# Patient Record
Sex: Female | Born: 1965 | Race: White | Smoking: Never smoker
Health system: Northeastern US, Community
[De-identification: ages and names within clinical notes are randomized; demographics above are authoritative.]

## PROBLEM LIST (undated history)

## (undated) DIAGNOSIS — F418 Other specified anxiety disorders: Secondary | ICD-10-CM

## (undated) DIAGNOSIS — R7611 Nonspecific reaction to tuberculin skin test without active tuberculosis: Secondary | ICD-10-CM

## (undated) DIAGNOSIS — F329 Major depressive disorder, single episode, unspecified: Secondary | ICD-10-CM

## (undated) DIAGNOSIS — F32A Depression, unspecified: Secondary | ICD-10-CM

## (undated) DIAGNOSIS — E162 Hypoglycemia, unspecified: Secondary | ICD-10-CM

## (undated) DIAGNOSIS — J45909 Unspecified asthma, uncomplicated: Secondary | ICD-10-CM

## (undated) DIAGNOSIS — E669 Obesity, unspecified: Secondary | ICD-10-CM

## (undated) HISTORY — DX: Major depressive disorder, single episode, unspecified: F32.9

## (undated) HISTORY — DX: Other specified anxiety disorders: F41.8

## (undated) HISTORY — DX: Nonspecific reaction to tuberculin skin test without active tuberculosis: R76.11

## (undated) HISTORY — PX: TONSILLECTOMY ONE-HALF AGE 12/>: ENT171

## (undated) HISTORY — DX: Unspecified asthma, uncomplicated: J45.909

## (undated) HISTORY — PX: UNLISTED LAPAROSCOPY PROCEDURE APPENDIX: GID338

## (undated) HISTORY — PX: TOTAL ABDOMINAL HYSTERECT W/WO RMVL TUBE OVARY: REP152

## (undated) HISTORY — DX: Depression, unspecified: F32.A

## (undated) HISTORY — DX: Hypoglycemia, unspecified: E16.2

## (undated) HISTORY — DX: Obesity, unspecified: E66.9

## (undated) HISTORY — PX: APPENDECTOMY: GID334

## (undated) HISTORY — PX: OB ANTEPARTUM CARE CESAREAN DLVR & POSTPARTUM: REP299

## (undated) HISTORY — PX: PR ANESTHESIA CESAREAN DELIVERY ONLY: 01961

## (undated) HISTORY — PX: TONSILLECTOMY AND ADENOIDECTOMY: SHX28

---

## 2007-11-15 IMAGING — US US EXTREM LOW VENOUS*L*
1 series · 17 of 24 positions shown · non-contrast
Comparison: none

REASON FOR EXAM: CALF PAIN - RM 2
COMMENTS:

PROCEDURE:     US  - US DOPPLER LOW EXTR LEFT  - April 23, 2007  [DATE]
RESULT:     The LEFT femoral and popliteal veins are normally compressible.
The waveform patterns are normal and the color-flow images are normal.

[Series 1: us extrem low venous*left* · 17 of 33 slices shown]
[im 1/33]
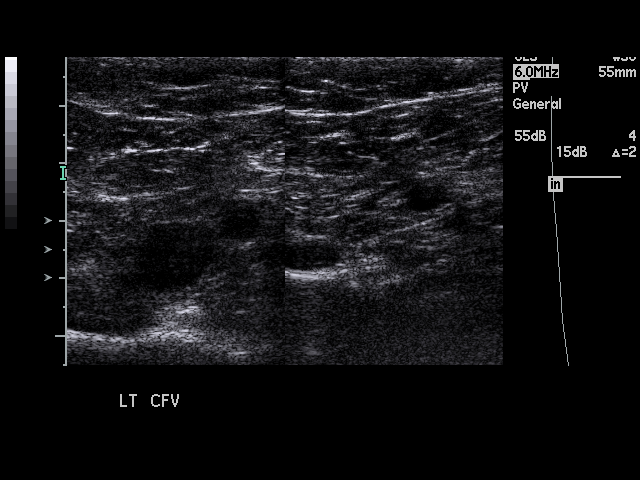
[im 3/33]
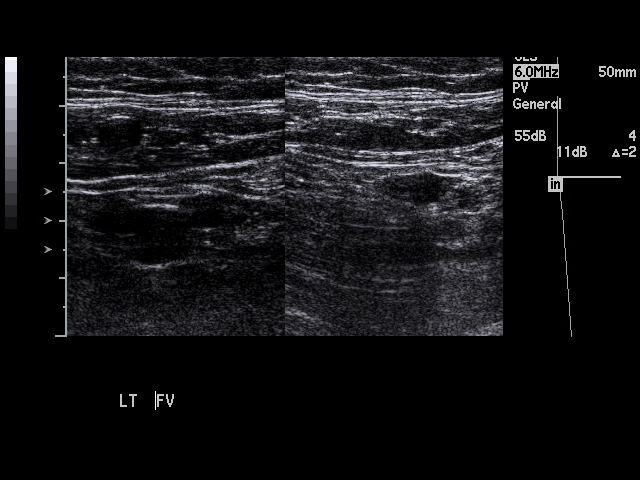
[im 5/33]
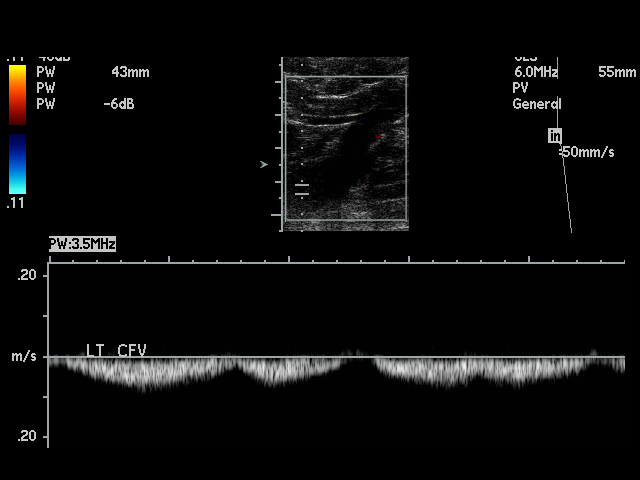
[im 6/33]
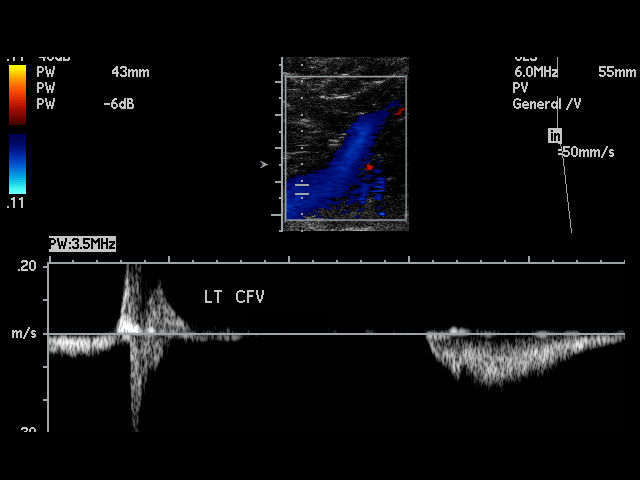
[im 9/33]
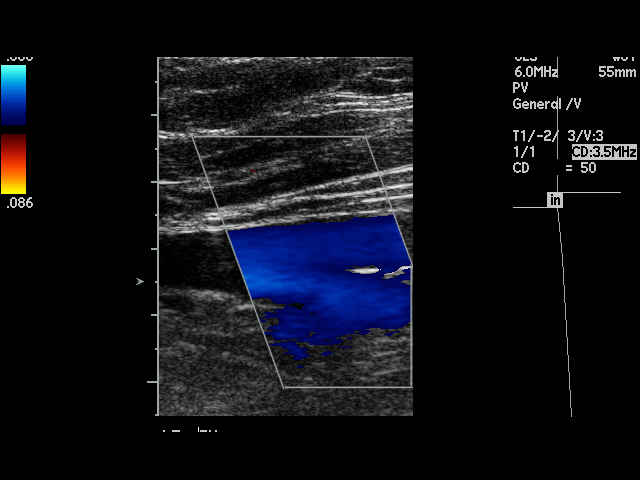
[im 10/33]
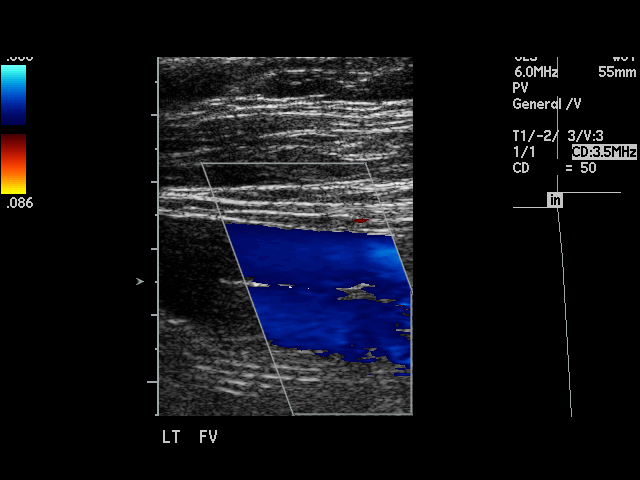
[im 13/33]
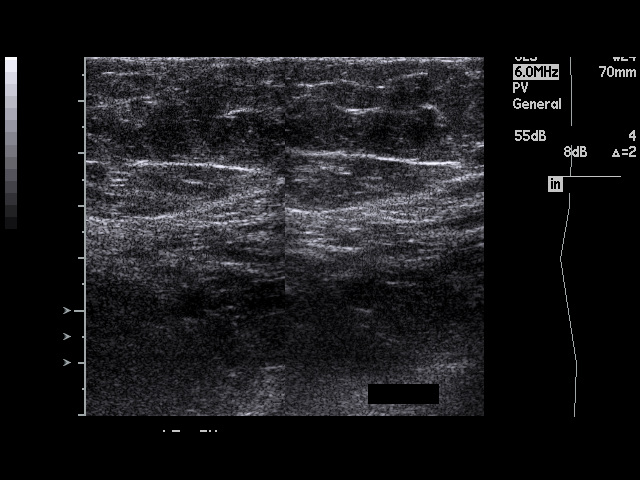
[im 14/33]
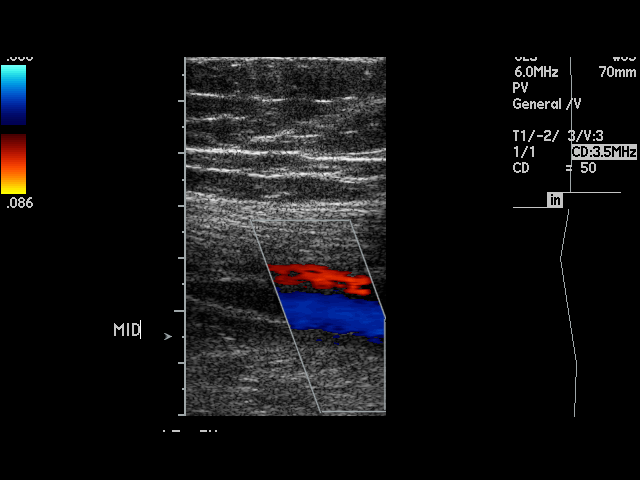
[im 17/33]
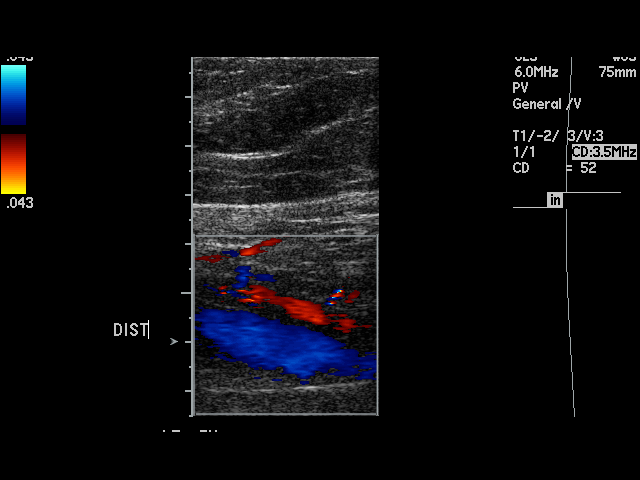
[im 19/33]
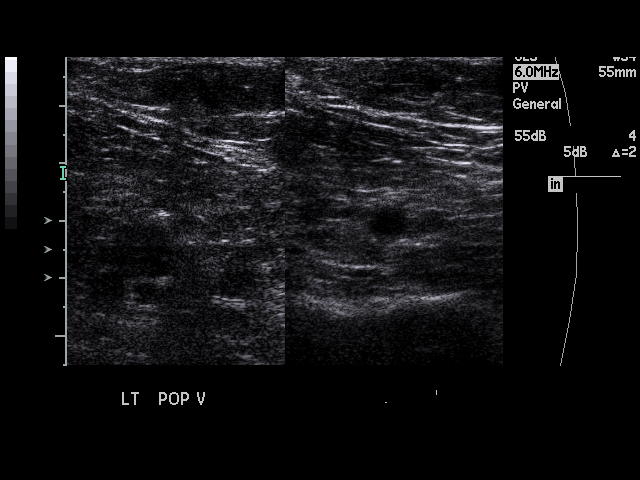
[im 20/33]
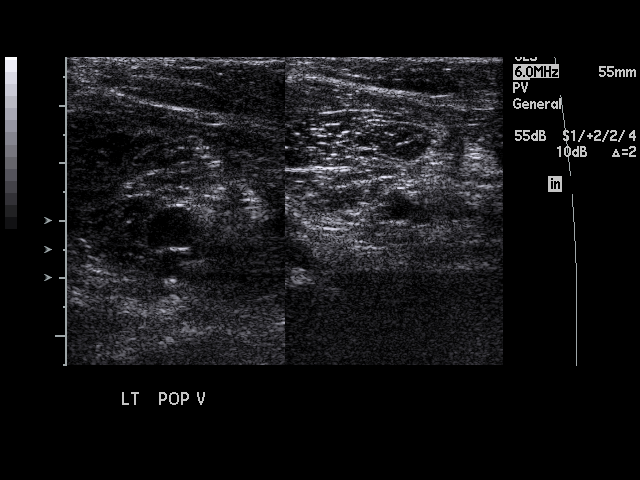
[im 23/33]
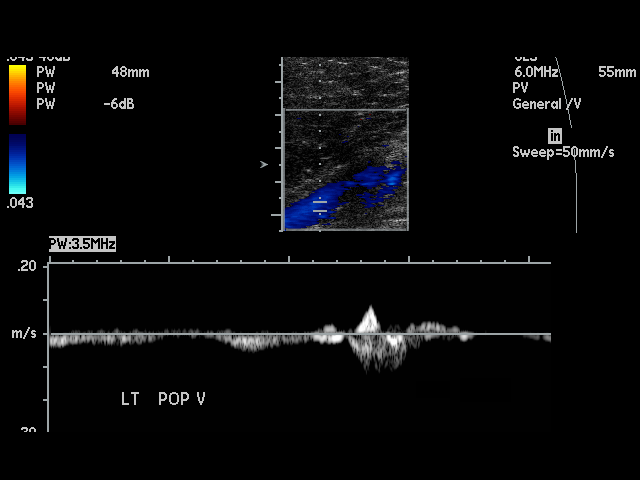
[im 24/33]
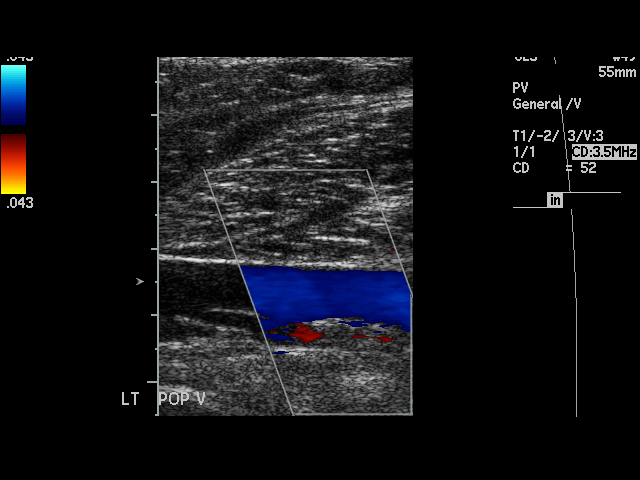
[im 27/33]
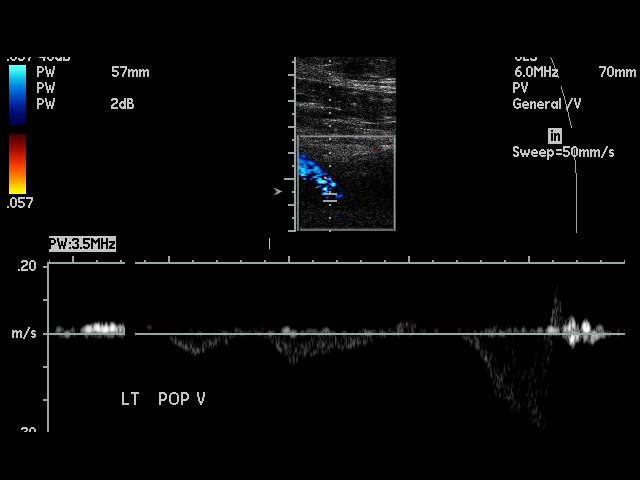
[im 28/33]
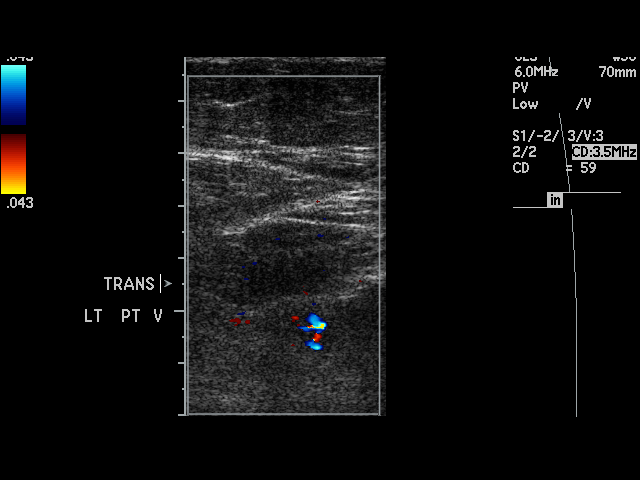
[im 30/33]
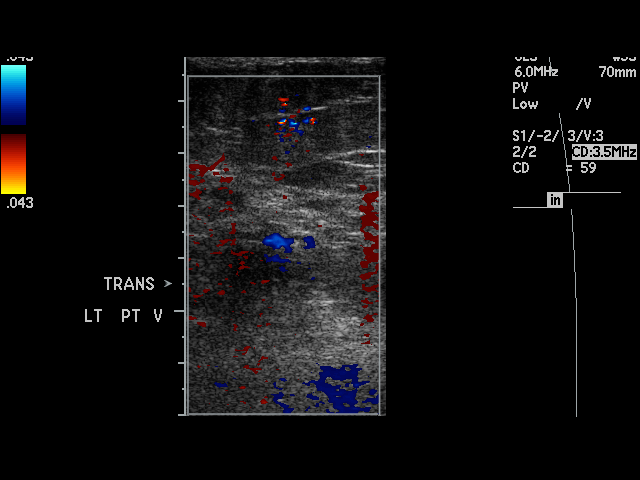
[im 33/33]
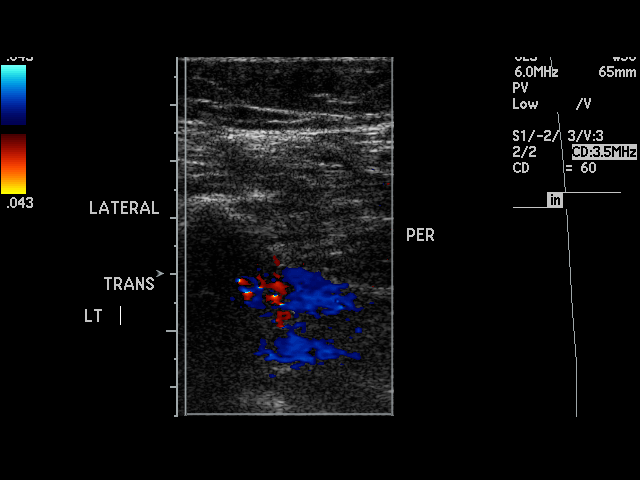

[17 of 24 positions shown; findings below may reference images not displayed]

IMPRESSION: 1)I do not see evidence of thrombus within the LEFT femoral or popliteal
veins.

A preliminary report was sent to the [HOSPITAL] the conclusion
of the study.

## 2008-01-04 ENCOUNTER — Inpatient Hospital Stay (HOSPITAL_BASED_OUTPATIENT_CLINIC_OR_DEPARTMENT_OTHER)
Admission: RE | Admit: 2008-01-04 | Disposition: A | Discharge status: Home / Self Care | Payer: Self-pay | Source: Emergency Department | Attending: Nephrology | Admitting: Nephrology

## 2008-01-04 ENCOUNTER — Encounter (HOSPITAL_BASED_OUTPATIENT_CLINIC_OR_DEPARTMENT_OTHER): Payer: Self-pay

## 2008-01-04 ENCOUNTER — Emergency Department (HOSPITAL_BASED_OUTPATIENT_CLINIC_OR_DEPARTMENT_OTHER)
Admission: RE | Admit: 2008-01-04 | Discharge status: Against Medical Advice | Payer: Self-pay | Source: Emergency Department | Attending: Emergency Medicine | Admitting: Emergency Medicine

## 2008-01-04 LAB — HCG QUALITATIVE SERUM: HCG QUALITATIVE SERUM: NEGATIVE

## 2008-01-04 LAB — CK-MB PROFILE
CK INDEX: 2.2 %
CKMB: 1.1 ng/mL (ref 0.0–4.0)
CREATINE KINASE TOTAL: 49 IU/L (ref 21–215)

## 2008-01-04 LAB — D-DIMER PE/DVT, QUANTITATIVE
D-DIMER PE/DVT, QUANTITATIVE: 1.9 mg/L FEU (ref 0.00–0.99)
D-DIMER PE/DVT, QUANTITATIVE: 1.96 mg/L FEU (ref 0.00–0.99)

## 2008-01-04 LAB — COMPREHENSIVE METABOLIC PANEL
ALANINE AMINOTRANSFERASE: 21 IU/L (ref 7–35)
ALBUMIN: 3.9 g/dl (ref 3.4–4.8)
ALKALINE PHOSPHATASE: 55 IU/L (ref 25–106)
ANION GAP: 7 mmol/L (ref 2–25)
ASPARTATE AMINOTRANSFERASE: 17 IU/L (ref 8–34)
BILIRUBIN TOTAL: 0.6 mg/dl (ref 0.2–1.1)
BUN (UREA NITROGEN): 19 mg/dl (ref 6–20)
CALCIUM: 9.1 mg/dl (ref 8.6–10.0)
CARBON DIOXIDE: 27 mmol/L (ref 22–32)
CHLORIDE: 107 mmol/L (ref 101–111)
CREATININE: 0.8 mg/dl (ref 0.4–1.2)
ESTIMATED GLOMERULAR FILT RATE: >60 mL/min (ref 60–116)
Glucose Random: 108 mg/dl (ref 74–160)
POTASSIUM: 4.4 mmol/L (ref 3.5–5.1)
SODIUM: 141 mmol/L (ref 135–144)
TOTAL PROTEIN: 6.6 g/dl (ref 5.9–7.5)

## 2008-01-04 LAB — SERUM DRUG SCREEN 6 DRUGS
ACETAMINOPHEN: <10 ug/ml — ABNORMAL LOW (ref 10–30)
BARBITURATE: NEGATIVE
BENZODIAZEPINE: NEGATIVE
ETHANOL: <10 mg/dl (ref <10)
SALICYLATE: <4 mg/dl — ABNORMAL LOW (ref 4–29)
TRICYCLIC ANTIDEPRESSANTS: NEGATIVE

## 2008-01-04 LAB — TROPONIN I: TROPONIN I: 0.01 ng/mL (ref 0.00–0.04)

## 2008-01-04 LAB — BLOOD COUNT COMPLETE AUTO&AUTO DIFRNTL WBC
BASOPHIL %: 0.9 % (ref 0.0–2.0)
EOSINOPHIL %: 2.9 % (ref 0.0–7.0)
HEMATOCRIT: 42.3 % (ref 36.0–48.0)
HEMOGLOBIN: 14.2 g/dl (ref 12.0–16.0)
LYMPHOCYTE %: 34.2 % (ref 13.0–39.0)
MEAN CORP HGB CONC: 33.6 g/dl (ref 32.0–36.0)
MEAN CORPUSCULAR HGB: 30.5 pg (ref 27.0–33.0)
MEAN CORPUSCULAR VOL: 90.7 fl (ref 80.0–100.0)
MEAN PLATELET VOLUME: 10.2 fl (ref 6.4–10.8)
MONOCYTE %: 6.7 % (ref 1.0–12.0)
NEUTROPHIL %: 55.3 % (ref 46.0–79.0)
PLATELET COUNT: 256 10*3/uL (ref 150–400)
RBC DISTRIBUTION WIDTH: 12.9 % (ref 11.5–14.3)
RED BLOOD CELL COUNT: 4.66 M/uL (ref 4.50–5.10)
WHITE BLOOD CELL COUNT: 7.1 10*3/uL (ref 4.0–10.8)

## 2008-01-04 LAB — URINE DRUG SCREEN 7 DRUGS
AMPHETAMINES URINE: NEGATIVE
BARBITURATES URINE: NEGATIVE
BENZODIAZEPINES URINE: NEGATIVE
CANNABINOIDS URINE: NEGATIVE
COCAINE METABOLITES URINE: NEGATIVE
ETHANOL URINE: NEGATIVE
OPIATES URINE: NEGATIVE

## 2008-01-04 LAB — XR CHEST 2 VIEWS

## 2008-01-04 LAB — HOLD BLUE TOP TUBE

## 2008-01-04 LAB — CT HEAD WO CONTRAST

## 2008-01-04 LAB — HOLD PURPLE TOP TUBE

## 2008-01-04 LAB — CHG SEDIMENTATION RATE RBC NON-AUTOMATED: RBC SEDIMENTATION RATE: 21 MM/HR — ABNORMAL HIGH (ref 0–15)

## 2008-01-04 NOTE — ED Notes (Signed)
Pt to ED with c/o waking up in bathroom this am, last memory taking shower last night.

## 2008-01-05 LAB — BLOOD COUNT COMPLETE AUTO&AUTO DIFRNTL WBC
BASOPHIL %: 0.5 % (ref 0.0–2.0)
EOSINOPHIL %: 3.6 % (ref 0.0–7.0)
HEMATOCRIT: 39.3 % (ref 36.0–48.0)
HEMOGLOBIN: 13.3 g/dl (ref 12.0–16.0)
LYMPHOCYTE %: 35.8 % (ref 13.0–39.0)
MEAN CORP HGB CONC: 33.9 g/dl (ref 32.0–36.0)
MEAN CORPUSCULAR HGB: 30.5 pg (ref 27.0–33.0)
MEAN CORPUSCULAR VOL: 90 fl (ref 80.0–100.0)
MEAN PLATELET VOLUME: 10.1 fl (ref 6.4–10.8)
MONOCYTE %: 6.6 % (ref 1.0–12.0)
NEUTROPHIL %: 53.5 % (ref 46.0–79.0)
PLATELET COUNT: 254 10*3/uL (ref 150–400)
RBC DISTRIBUTION WIDTH: 13 % (ref 11.5–14.3)
RED BLOOD CELL COUNT: 4.36 M/uL — ABNORMAL LOW (ref 4.50–5.10)
WHITE BLOOD CELL COUNT: 8.2 10*3/uL (ref 4.0–10.8)

## 2008-01-05 LAB — CK-MB PROFILE
CK INDEX: 2.7 %
CKMB: 1.3 ng/mL (ref 0.0–4.0)
CREATINE KINASE TOTAL: 48 IU/L (ref 21–215)

## 2008-01-05 LAB — TROPONIN I: TROPONIN I: 0.03 ng/mL (ref 0.00–0.04)

## 2008-01-05 LAB — BLOOD SUGAR FINGERSTICK (POINT OF CARE)
FINGERSTICK GLUCOSE: 85 mg/dl (ref 74–160)
FINGERSTICK GLUCOSE: 90 mg/dl (ref 74–160)

## 2008-01-05 LAB — D-DIMER PE/DVT, QUANTITATIVE: D-DIMER PE/DVT, QUANTITATIVE: 1.92 mg/L FEU (ref 0.00–0.99)

## 2008-01-05 LAB — US EXTREMITY VENOUS DOPPLER BILATERAL

## 2008-01-05 LAB — NM LUNG PERFUSION & VENTILATION IMAGING

## 2008-01-06 LAB — CHG ASSAY OF AMMONIA: AMMONIA: 16 umol/L (ref 11–35)

## 2008-01-06 LAB — BLOOD SUGAR FINGERSTICK (POINT OF CARE)
FINGERSTICK GLUCOSE: 104 mg/dl (ref 74–160)
FINGERSTICK GLUCOSE: 101 mg/dl (ref 74–160)

## 2008-01-06 LAB — HEMOGLOBIN A1C
HEMOGLOBIN A1C: 5.6 % (ref 0–6.0)
HEMOGLOBIN A1C: 5.6 % (ref 0–6.0)

## 2008-01-06 LAB — ALDOLASE: ALDOLASE: 3.9 U/L (ref 1.2–7.6)

## 2008-01-06 LAB — CK-MB PROFILE
CK INDEX: 2.2 %
CK INDEX: 2.6 %
CKMB: 1.3 ng/mL (ref 0.0–4.0)
CKMB: 1.2 ng/mL (ref 0.0–4.0)
CREATINE KINASE TOTAL: 60 IU/L (ref 21–215)
CREATINE KINASE TOTAL: 47 IU/L (ref 21–215)

## 2008-01-06 LAB — CHG HEPATIC FUNCTION PANEL
ALANINE AMINOTRANSFERASE: 25 IU/L (ref 7–35)
ALBUMIN: 3.7 g/dl (ref 3.4–4.8)
ALKALINE PHOSPHATASE: 50 IU/L (ref 25–106)
ASPARTATE AMINOTRANSFERASE: 16 IU/L (ref 8–34)
BILIRUBIN DIRECT: 0.1 mg/dl (ref 0.0–0.2)
BILIRUBIN TOTAL: 0.6 mg/dl (ref 0.2–1.1)
INDIRECT BILIRUBIN: 0.5 mg/dl (ref 0.2–0.9)
TOTAL PROTEIN: 6.5 g/dl (ref 5.9–7.5)

## 2008-01-06 LAB — MRA BRAIN WO CONTRAST

## 2008-01-06 LAB — EKG-12 ** TO BE DONE BY EKG **

## 2008-01-06 LAB — BASIC METABOLIC PANEL FASTING
ANION GAP: 3 mmol/L (ref 2–25)
BUN (UREA NITROGEN): 11 mg/dl (ref 6–20)
CALCIUM: 8.6 mg/dl (ref 8.6–10.0)
CARBON DIOXIDE: 27 mmol/L (ref 22–32)
CHLORIDE: 109 mmol/L (ref 101–111)
CREATININE: 0.7 mg/dl (ref 0.4–1.2)
ESTIMATED GLOMERULAR FILT RATE: >60 mL/min (ref 60–116)
GLUCOSE FASTING: 107 mg/dl (ref 74–118)
POTASSIUM: 4.4 mmol/L (ref 3.5–5.1)
SODIUM: 139 mmol/L (ref 135–144)

## 2008-01-06 LAB — MRI BRAIN WO CONTRAST

## 2008-01-06 LAB — TROPONIN I
TROPONIN I: 0.04 ng/mL (ref 0.00–0.04)
TROPONIN I: 0.02 ng/mL (ref 0.00–0.04)

## 2008-01-06 LAB — CHG ASSAY OF MAGNESIUM: MAGNESIUM: 2.3 mg/dl (ref 1.8–2.6)

## 2008-01-06 LAB — US RENAL

## 2008-01-06 LAB — CHG ASSAY OF PHOSPHORUS INORGANIC: PHOSPHORUS: 4 mg/dl (ref 2.7–4.7)

## 2008-01-06 LAB — HOLD PURPLE TOP TUBE

## 2008-01-06 LAB — B-TYPE NATRIURETIC PEPTIDE: B-TYPE NATRIURETIC PEPTIDE: 50 pg/mL (ref 5–100)

## 2008-01-06 LAB — H&P

## 2008-01-07 LAB — BLOOD COUNT COMPLETE AUTO&AUTO DIFRNTL WBC
BASOPHIL %: 0.8 % (ref 0.0–2.0)
EOSINOPHIL %: 3.7 % (ref 0.0–7.0)
HEMATOCRIT: 41 % (ref 36.0–48.0)
HEMOGLOBIN: 14.1 g/dl (ref 12.0–16.0)
LYMPHOCYTE %: 40 % — ABNORMAL HIGH (ref 13.0–39.0)
MEAN CORP HGB CONC: 34.4 g/dl (ref 32.0–36.0)
MEAN CORPUSCULAR HGB: 31 pg (ref 27.0–33.0)
MEAN CORPUSCULAR VOL: 90.1 fl (ref 80.0–100.0)
MEAN PLATELET VOLUME: 10.8 fl (ref 6.4–10.8)
MONOCYTE %: 6.7 % (ref 1.0–12.0)
NEUTROPHIL %: 48.8 % (ref 46.0–79.0)
PLATELET COUNT: 252 10*3/uL (ref 150–400)
RBC DISTRIBUTION WIDTH: 12.9 % (ref 11.5–14.3)
RED BLOOD CELL COUNT: 4.56 M/uL (ref 4.50–5.10)
WHITE BLOOD CELL COUNT: 7.9 10*3/uL (ref 4.0–10.8)

## 2008-01-07 LAB — B-TYPE NATRIURETIC PEPTIDE: B-TYPE NATRIURETIC PEPTIDE: 15 pg/mL (ref 5–100)

## 2008-01-07 LAB — CHG ASSAY OF PHOSPHORUS INORGANIC: PHOSPHORUS: 4.1 mg/dl (ref 2.7–4.7)

## 2008-01-07 LAB — BASIC METABOLIC PANEL
ANION GAP: 4 mmol/L (ref 2–25)
BUN (UREA NITROGEN): 15 mg/dl (ref 6–20)
CALCIUM: 9 mg/dl (ref 8.6–10.0)
CARBON DIOXIDE: 31 mmol/L (ref 22–32)
CHLORIDE: 108 mmol/L (ref 101–111)
CREATININE: 0.8 mg/dl (ref 0.4–1.2)
ESTIMATED GLOMERULAR FILT RATE: >60 mL/min (ref 60–116)
Glucose Random: 91 mg/dl (ref 74–160)
POTASSIUM: 5 mmol/L (ref 3.5–5.1)
SODIUM: 143 mmol/L (ref 135–144)

## 2008-01-07 LAB — PTH INTACT WITH CALCIUM
CALCIUM: 9 mg/dl (ref 8.6–10.0)
PTH INTACT: 39 pg/mL (ref 12–88)

## 2008-01-07 LAB — CREATININE 24 HOUR URINE
CREATININE 24HR URINE MG%: 47 mg/dl
CREATININE 24HR URINE: 1.2 g/(24.h) (ref 0.6–1.8)
TOTAL VOLUME 24 HOUR URINE: 2650 ml/24HR — ABNORMAL HIGH (ref 600–1600)

## 2008-01-07 LAB — PROTEIN 24 HOUR URINE: PROTEIN 24HR URINE MG/DL: <6 mg/dl

## 2008-01-07 LAB — XR ABDOMEN (KUB) 1 VIEW

## 2008-01-07 LAB — BLOOD SUGAR FINGERSTICK (POINT OF CARE): FINGERSTICK GLUCOSE: 97 mg/dl (ref 74–160)

## 2008-01-07 LAB — CHG ASSAY OF THYROXINE TOTAL: THYROXINE (T4): 7.4 ug/dl (ref 6.09–12.23)

## 2008-01-07 LAB — TSH (THYROID STIMULATING HORMONE): TSH (THYROID STIM HORMONE): 2.18 u[IU]/mL (ref 0.34–5.60)

## 2008-01-07 LAB — CORTISOL AM: CORTISOL AM: 12.3 ug/dl (ref 4.3–22.4)

## 2008-01-07 LAB — CORTISOL RANDOM: CORTISOL RANDOM: 12.3 ug/dl (ref 3.1–22.4)

## 2008-01-07 LAB — CHG CREATININE CLEARANCE
CREATININE CLEARANCE: 124 mL/min (ref 70–130)
CREATININE: 0.7 mg/dl (ref 0.4–1.2)

## 2008-01-08 LAB — EMERGENCY ROOM NOTE

## 2008-01-09 ENCOUNTER — Encounter (HOSPITAL_BASED_OUTPATIENT_CLINIC_OR_DEPARTMENT_OTHER): Payer: Self-pay | Admitting: Internal Medicine

## 2008-01-09 ENCOUNTER — Ambulatory Visit (HOSPITAL_BASED_OUTPATIENT_CLINIC_OR_DEPARTMENT_OTHER): Payer: Self-pay | Admitting: Internal Medicine

## 2008-01-09 LAB — CHG THYROID HORM UPTK/THYROID HORMONE BINDING RATIO: T3 UPTAKE: 32 % (ref 24–39)

## 2008-01-09 LAB — BLOOD SUGAR FINGERSTICK (POINT OF CARE): FINGERSTICK GLUCOSE: 96 mg/dl (ref 74–160)

## 2008-01-11 LAB — HOLTER MONITORING

## 2008-01-11 LAB — ECHOCARDIOGRAM WITH DOPPLER

## 2008-01-19 LAB — CONSULTATION

## 2008-01-23 ENCOUNTER — Ambulatory Visit: Payer: Self-pay | Admitting: Nephrology

## 2008-01-30 LAB — HOLTER MONITORING

## 2008-02-03 LAB — H&P

## 2008-02-03 LAB — DISCHARGE SUMMARY

## 2008-04-12 ENCOUNTER — Ambulatory Visit (HOSPITAL_BASED_OUTPATIENT_CLINIC_OR_DEPARTMENT_OTHER): Payer: Commercial Managed Care - HMO | Admitting: Family Medicine

## 2008-04-12 ENCOUNTER — Encounter (HOSPITAL_BASED_OUTPATIENT_CLINIC_OR_DEPARTMENT_OTHER): Payer: Self-pay | Admitting: Family Medicine

## 2008-04-12 VITALS — BP 126/90 | HR 88 | Temp 99.2°F | Resp 20 | Wt 254.0 lb

## 2008-04-12 DIAGNOSIS — F418 Other specified anxiety disorders: Secondary | ICD-10-CM

## 2008-04-12 DIAGNOSIS — G43909 Migraine, unspecified, not intractable, without status migrainosus: Secondary | ICD-10-CM

## 2008-04-12 DIAGNOSIS — R7611 Nonspecific reaction to tuberculin skin test without active tuberculosis: Secondary | ICD-10-CM | POA: Insufficient documentation

## 2008-04-12 DIAGNOSIS — G43709 Chronic migraine without aura, not intractable, without status migrainosus: Secondary | ICD-10-CM | POA: Insufficient documentation

## 2008-04-12 DIAGNOSIS — Z113 Encounter for screening for infections with a predominantly sexual mode of transmission: Secondary | ICD-10-CM

## 2008-04-12 HISTORY — DX: Nonspecific reaction to tuberculin skin test without active tuberculosis: R76.11

## 2008-04-12 HISTORY — DX: Other specified anxiety disorders: F41.8

## 2008-04-12 MED ORDER — IMITREX 100 MG PO TABS
ORAL_TABLET | ORAL | Status: DC
Start: 2008-04-12 — End: 2010-01-16

## 2008-04-12 MED ORDER — FLUOXETINE HCL 10 MG PO CAPS
ORAL_CAPSULE | ORAL | Status: DC
Start: 2008-04-12 — End: 2008-05-04

## 2008-04-12 MED ORDER — LORAZEPAM 1 MG PO TABS
ORAL_TABLET | ORAL | Status: DC
Start: 2008-04-12 — End: 2008-07-28

## 2008-04-12 NOTE — Progress Notes (Signed)
S: Had syncopal episode 9m ago and was hospitalized for 69m, soon after started having anxiety. 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  Ok with patients and their family but aggravated by the staff.  90% of anxiety at work, then feels worn out when goes home. This then leads to her depressive feelings. Feels very overwhelmed.    Daughter 21y w/bipolar who ran and got married an is now pregnant.  Pt has separated contact with her 3 daughters and expanded family.     Goes to beach as relaxing place, has a girlfriend she talks to.  Boyfriend someitmes supportive.     Takes Ativan x1 before going to work and this helps.  The brunt of her anxiety stems from her anger issues that she faces at work with her co-workers.  She has developed appropriate coping mechanisms including taking a walk after any frustrating encounter.  She has anxiety about her ability to contain her anger and this exhausts her to the point of depression when she goes home.      She has joined [employer] watchers to help make lifestyle changes in diet and exercise to start losing weight.      O: BP 126/90  Pulse 88  Temp (Src) 99.2 F (37.3 C) (Oral)  Resp 20  Wt 254 lb (115.214 kg)  SpO2 97%  LMP Hysterectomy  Pain Score: 0 (0/10)  Gen: obese female, NAD  CV: RRR no murmur  Resp: CTAB no r/r/w  Eyes: PEARL, EOMI  Neuro: CN2-12 intact, DTR +2 bilaterally.  Sensation grossly intact. 5/5 strength bilaterally upper and lower extremity  EXT: +2 pulses, no edema    A/P: 41yF with anxiety related depression   1. Anxiety related depression: PHQ9 18  - spent >50% of visit in counseling  - Cont Ativan 1mg  daily before going to work  - Start Prozac daily  - Will call for f/u in 1wk and return in 5m for eval  - discussed in detail appropriate coping mechanisms  2. STD screening  - blood HIV (consent signed), RPR, HepB, HepC  - urine GC/C  3. Health maintenance  - check lipids today  - will return for full physical and ?PAP  (pt uncertain if they left her cervix in place)  4. Migraines  - start imitrex prn  * Patient given AVS  * Discussed visit with patient and patient understands the course of the visit and all questions were answered.  * Patient to return to clinic 74m for:      a) f/u depression and physical  *  Patient discussed with Dr. Jackelyn Knife

## 2008-04-13 LAB — CHLAMYDIA GC NAAT
GENPROBE CHLAMYDIA: NEGATIVE
GENPROBE GC: NEGATIVE

## 2008-04-21 LAB — CHG LIPID PANEL
Cholesterol: 192 mg/dl (ref 0–200)
HIGH DENSITY LIPOPROTEIN: 35 mg/dl (ref 35–85)
LOW DENSITY LIPOPROTEIN DIRECT: 125 mg/dl — ABNORMAL HIGH (ref 0–100)
RISK FACTOR: 5.5 — ABNORMAL HIGH (ref ?–4.4)
TRIGLYCERIDES: 91 mg/dl (ref 0–150)

## 2008-04-21 LAB — HIV 1 AND 2 PLUS O ANTIBODY: HIV 1 AND 2 PLUS O SCREEN: NONREACTIVE

## 2008-04-21 LAB — TREPONEMA PALLIDUM AB IGG: TREPONEMA PALLIDUM AB IgG: NONREACTIVE

## 2008-04-21 LAB — HEPATITIS B SURFACE ANTIGEN: HEPATITIS B SURFACE ANTIGEN: NONREACTIVE

## 2008-04-21 LAB — HEPATITIS B SURFACE ANTIBODY: HEPATITIS B SURFACE ANTIBODY: REACTIVE

## 2008-04-21 LAB — HEPATITIS C ANTIBODY: HEPATITIS C ANTIBODY: NEGATIVE

## 2008-04-24 NOTE — Progress Notes (Addendum)
This pt was precepted with the resident and the case was reviewed in its entirety. The pt was not seen by me. I agree with the plan and follow up as is noted in the resident note.

## 2008-05-04 ENCOUNTER — Ambulatory Visit (HOSPITAL_BASED_OUTPATIENT_CLINIC_OR_DEPARTMENT_OTHER): Payer: Commercial Managed Care - HMO | Admitting: Family Medicine

## 2008-05-04 VITALS — BP 108/60 | HR 83 | Temp 99.3°F | Resp 20 | Ht 61.0 in | Wt 257.0 lb

## 2008-05-04 DIAGNOSIS — Z0000 Encounter for general adult medical examination without abnormal findings: Principal | ICD-10-CM

## 2008-05-04 DIAGNOSIS — E669 Obesity, unspecified: Secondary | ICD-10-CM

## 2008-05-04 DIAGNOSIS — F418 Other specified anxiety disorders: Secondary | ICD-10-CM

## 2008-05-04 HISTORY — DX: Obesity, unspecified: E66.9

## 2008-05-04 MED ORDER — FLUOXETINE HCL 20 MG PO CAPS
ORAL_CAPSULE | ORAL | Status: DC
Start: 2008-05-04 — End: 2008-08-21

## 2008-05-04 NOTE — Patient Instructions (Signed)
Exercise And Diabetes  Copyright © 1997 American Diabetes Association       Exercise, along with good nutrition and medications (insulin or oral diabetes pills), is important for good diabetes control. Good diabetes control means keeping your blood sugar level as close to normal (90-126 milligrams per deciliter [mg/dl]) as possible. Exercise is especially good for people with diabetes.       Why Is Exercise Important?   Exercise usually lowers blood sugar. That helps your body use its food supply better. Also, exercise may help insulin work better. If you are overweight, exercise, plus careful attention to diet, can help take off extra pounds.     Exercise is important in many other ways. It improves the flow of blood through the small blood vessels and increases your heart's pumping power. The right exercise program may make you look and feel better.       What Kinds Of Exercise Are Best?   Your health care provider can help you decide what kinds of exercise, and how much exercise, are best suited to your needs. If your blood sugar control is poor, do not exercise. Get medical advice first.     If you have retinopathy (diabetic eye disease) or blood vessel problems, you need your doctor's advice about which activities are safe.     Exercise has value only if it's done regularly. People with diabetes should exercise at least several days a week.     What do people with type 1 diabetes need to know about exercising?     Before starting any exercise program, check with your doctor. Your activity must be planned to fit in with your meal plan and with the action times and amounts of your insulin.     If you're exercising more than 1 hour after eating, it's a good idea to eat before your start. As a rule, a high-carbohydrate snack is good before or during mild to moderate exercise (walking, biking, or golf). Such a snack could be 6 ounces of fruit juice or one half of a plain bagel.     If you plan on doing heavier  exercise (aerobics, running, squash, or handball), you may need to eat a little more, such as half of a meat sandwich and a cup of low-fat milk.     It's always a good idea to check your blood sugar level before you start exercising. If you are low (under 70 mg/dl), you will need a snack to avoid having low blood sugar while you exercise. This would cause an insulin reaction.     A reaction might make you feel faint, sweaty, dizzy, or confused. An insulin reaction can occur while you exercise or several hours, even up to 12 hours, later.     If you feel an insulin reaction coming on while exercising, STOP. IMMEDIATELY have one-half cup of orange juice or nondiet soft drink or 3 glucose tablets.     You need to treat an insulin reaction as soon as you feel it. Don't wait, otherwise it could become worse. Whenever you exercise, you should bring along some raisins or Lifesavers' candy to eat just in case. They will raise your blood sugar level.     If you play a team sport such as baseball or basketball, you should let someone know you have diabetes and teach them how to help you, if needed. If you like running or cycling, do them with a friend or family member. If you can't   find anyone to go with you, let someone know where you are going and when you will be back.     With regular exercise, you will need to test your blood sugar more often.     What do people with type 2 diabetes need to know?     Almost 9 of 10 people with type 2 diabetes are overweight. Most often, they are also past 40. In many people, type 2 diabetes can be controlled through diet and exercise. For these reasons, exercise is a very important part of the diabetes control plan for those with type 2 diabetes.   Exercise burns calories that your body would otherwise store as extra weight. And because exercise also helps lower blood sugar levels, exercise can help your diabetes control.   If you use insulin or oral diabetes pills to control your type 2  diabetes, you should know your blood sugar level before you start exercising. If you are low, you may need a snack.   How Do You Begin Exercising?   The first step is to check with your doctor. Together, you can decide how much and what kinds of exercise are right for you. The right exercise, in the right amount, can do wonders. When balanced with your meal plan and medications, exercise will help you feel healthier and happier.

## 2008-05-04 NOTE — Progress Notes (Signed)
Chief Complaint:   Erica Arroyo is a 42 year old female who presents for PHYSICAL    History of Present Illness:    Pt c/o stress at work and at home as daughter who is victom of DV just moved home.     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]        Past Surgical History    ANESTHESIA CESAREAN DELIVERY ONLY     Comment: x3    UNLIS LAPS PX APPENDIX     TAH +-RMVL TUBE +-RMVL OVARY     Comment: ?cervix, at age 54 with "hemorrhaging"           Current outpatient prescriptions prior to encounter:  LORAZEPAM 1 MG OR TABS 1 TABLET PO once daily Disp: 30 Rfl: 2   IMITREX 100 MG OR TABS 1 TABLET 1 TIME ONLY Disp: 30 Rfl: 0   FLUOXETINE HCL 10 MG OR CAPS 1 CAPSULE at Bedtime Disp: 30 Rfl: 2         Review of Patient's Allergies indicates:   Amoxicillin             Hives   Penicillins             Rash   Tetracycline            Rash   Meperidine hcl          Rash   Hydromorphone hcl 1     Rash   Flu virus vaccine       Swelling      Social History   Marital Status: Divorced  Spouse Name: N/A    Years of Education: N/A  Number of Children: N/A     Occupational History  None on file     Social History Main Topics   Tobacco Use: Never    Alcohol Use: Yes    Comment: once every 3-33months    Drug Use: No    Sexually Active: Yes  Partner(s): Female     Other Topics Concern   None     Social History Narrative    Lives alone, works as Engineer, 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  Has a boyfriend, does not live together.  Been together since 11/08.     Middle daughter bipolar, recently ran away and got married and pregnant at age 14.     Other 2 daughters with behavioral issues and not in regular contact.         Family History    Heart Father    Comment: multiple MI first in early 54's, has multiple Stents    Renal Father    Comment: on dialysis    Hypertension Father    GI Father    Comment: diverticulitis    Diabetes Father    No Known Family  History Mother    Psychiatric Illness Brother    Comment: bipolar, schizophrenic    Neurological Sister    Comment: ? early dementia                Review Of Systems    Skin: negative  Eyes: negative  Ears/Nose/Throat: negative  Respiratory: has some shortness of breath when exercising a lot.  Cardiovascular: feels some chest heaviness when exercising a lot, no pain or pressure, just discomfort  Gastrointestinal: negative  Genitourinary: negative  Musculoskeletal: negative  Neurologic: negative  Psychiatric:has depression and anxiety  Hematologic/Lymphatic/Immunologic: negative  Endocrine: negative                    PHYSICAL EXAMINATION:  BP 108/60  Pulse 83  Temp (Src) 99.3 F (37.4 C) (Oral)  Resp 20  Ht 5\' 1"  (1.549 m)  Wt 257 lb (116.574 kg)  SpO2 97%  LMP Hysterectomy  General appearance: healthy, alert, well developed, well nourished, obese  Skin: skin color, texture, turgor are normal  Head: Normocephalic. No masses, lesions, tenderness or abnormalities  Eyes: conjunctivae/corneas clear. PERRL, EOM's intact. Fundi benign  Ears: External ears normal. Canals clear. TM's normal.  Nose/Sinuses: Nares normal. Septum midline. Mucosa normal. No drainage or sinus tenderness.  Oropharynx: Lips, mucosa, and tongue normal. Teeth and gums normal. Oropharynx moist and without lesion  Neck: Neck supple. No adenopathy. Thyroid symmetric, normal size, and without nodularity  Back: Back symmetric, no curvature. ROM normal. No CVA tenderness.  Lungs: Percussion normal. Good diaphragmatic excursion. Lungs clear to auscultation bilaterally  Heart: PMI normal. No lifts, heaves, or thrills. RRR. No murmurs, clicks, gallops or rubs  Breasts: Inspection negative. No nipple discharge or bleeding. No masses or nodularity palpable  Abdomen: Abdomen soft, non-tender. BS normal. No masses, no organomegaly  Extremities: Extremities normal. No deformities, edema, or skin discoloration  Musculoskeletal: Spine ROM normal. Muscular  strength intact.  Peripheral pulses: radial=4/4, femoral=4/4, popliteal=4/4, dorsalis pedis=4/4  Neuro: Gait normal. Reflexes normal and symmetric. Sensation grossly normal       Assessment / Plan: 50y F here for physical exam  - health maintenance labs done at last visit were wnl  - no PAP as pt had TAH  - Obesity: pt referred to GI for consideration for gastric bypass  - Depression/anxiety: pt to increase prozac to 20mg  daily  - Mammogram referral given today    * Patient given AVS  * Discussed visit with patient and patient understands the course of the visit and all questions were answered.  * Patient to return to clinic ~4wk for:      a) f/u depression/anxiety  *  Patient discussed with Dr. Lawson Fiscal    -

## 2008-05-10 NOTE — Progress Notes (Addendum)
I personally reviewed patient's history and exam findings with Dr. Rana.  I confirm the key elements of the history and physical exam as described in the resident's note.  I agree with the assessment and plan as described by the resident.  Please see resident's note for further details.

## 2008-05-14 ENCOUNTER — Encounter (HOSPITAL_BASED_OUTPATIENT_CLINIC_OR_DEPARTMENT_OTHER): Payer: Self-pay | Admitting: Family Medicine

## 2008-05-21 ENCOUNTER — Ambulatory Visit (HOSPITAL_BASED_OUTPATIENT_CLINIC_OR_DEPARTMENT_OTHER): Payer: Commercial Managed Care - HMO | Admitting: Family Medicine

## 2008-05-21 ENCOUNTER — Telehealth (HOSPITAL_BASED_OUTPATIENT_CLINIC_OR_DEPARTMENT_OTHER): Payer: Self-pay | Admitting: Ambulatory Care

## 2008-05-21 ENCOUNTER — Inpatient Hospital Stay (HOSPITAL_BASED_OUTPATIENT_CLINIC_OR_DEPARTMENT_OTHER)
Admission: RE | Admit: 2008-05-21 | Disposition: A | Discharge status: Home / Self Care | Payer: Self-pay | Source: Emergency Department | Attending: Emergency Medicine | Admitting: Emergency Medicine

## 2008-05-21 VITALS — BP 126/100 | HR 70 | Temp 98.3°F | Resp 16 | Wt 255.0 lb

## 2008-05-21 DIAGNOSIS — R1013 Epigastric pain: Principal | ICD-10-CM

## 2008-05-21 LAB — COMPREHENSIVE METABOLIC PANEL
ALANINE AMINOTRANSFERASE: 21 IU/L (ref 7–35)
ALBUMIN: 4 g/dl (ref 3.4–4.8)
ALKALINE PHOSPHATASE: 51 IU/L (ref 25–106)
ANION GAP: 8 mmol/L (ref 2–25)
ASPARTATE AMINOTRANSFERASE: 17 IU/L (ref 8–34)
BILIRUBIN TOTAL: 0.6 mg/dl (ref 0.2–1.1)
BUN (UREA NITROGEN): 10 mg/dl (ref 6–20)
CALCIUM: 9.3 mg/dl (ref 8.6–10.3)
CARBON DIOXIDE: 27 mmol/L (ref 22–32)
CHLORIDE: 101 mmol/L (ref 101–111)
CREATININE: 0.6 mg/dl (ref 0.4–1.2)
ESTIMATED GLOMERULAR FILT RATE: >60 mL/min (ref 60–116)
Glucose Random: 93 mg/dl (ref 74–160)
POTASSIUM: 4.2 mmol/L (ref 3.5–5.1)
SODIUM: 136 mmol/L (ref 135–144)
TOTAL PROTEIN: 7.1 g/dl (ref 5.9–7.5)

## 2008-05-21 LAB — BLOOD COUNT COMPLETE AUTO&AUTO DIFRNTL WBC
BASOPHIL %: 0.7 % (ref 0.0–2.0)
EOSINOPHIL %: 2.5 % (ref 0.0–7.0)
HEMATOCRIT: 41.4 % (ref 36.0–48.0)
HEMOGLOBIN: 14.1 g/dl (ref 12.0–16.0)
LYMPHOCYTE %: 35 % (ref 13.0–39.0)
MEAN CORP HGB CONC: 34.1 g/dl (ref 32.0–36.0)
MEAN CORPUSCULAR HGB: 30.2 pg (ref 27.0–33.0)
MEAN CORPUSCULAR VOL: 88.5 fl (ref 80.0–100.0)
MEAN PLATELET VOLUME: 10 fl (ref 6.4–10.8)
MONOCYTE %: 6.4 % (ref 1.0–12.0)
NEUTROPHIL %: 55.4 % (ref 46.0–79.0)
PLATELET COUNT: 265 10*3/uL (ref 150–400)
RBC DISTRIBUTION WIDTH: 12.7 % (ref 11.5–14.3)
RED BLOOD CELL COUNT: 4.68 M/uL (ref 4.50–5.10)
WHITE BLOOD CELL COUNT: 8.9 10*3/uL (ref 4.0–10.8)

## 2008-05-21 LAB — LIPASE: LIPASE: 75 U/L — ABNORMAL HIGH (ref 10–50)

## 2008-05-21 LAB — AMYLASE: AMYLASE: 108 U/L (ref 15–133)

## 2008-05-21 LAB — US ABDOMEN COMPLETE

## 2008-05-21 LAB — CHG BILIRUBIN DIRECT: BILIRUBIN DIRECT: 0.1 mg/dl (ref 0.0–0.2)

## 2008-05-21 LAB — US ABDOMEN PELVIS SCROTUM & OR RETROPERITONEUM DOPPLER LIMITED

## 2008-05-21 MED ORDER — OXYCODONE-ACETAMINOPHEN 5-325 MG PO TABS
ORAL_TABLET | ORAL | Status: AC
Start: 2008-05-21 — End: 2008-05-28

## 2008-05-21 NOTE — Telephone Encounter (Signed)
Staff Message copied by Caffie Pinto on Mon May 21, 2008 10:32 AM  ------   Message from: RIVERA, [place]   Created: Mon May 21, 2008 9:55 AM   Regarding: call back    Erica Arroyo [id number], 42 year old, female, Telephone Information:  Home Phone [phone]  Work Phone [phone]      Cleotis Lema NUMBER: [phone]  Cell phone:   Other phone:    Available times:    Patient's language of care: English    Patient does not need an interpreter.    Patient's Care Team:     Person calling on behalf of patient: Patient (self)    Calls today with a sick call. Pt would like to come in to be seen. Pls call back.    Patient's Preferred Pharmacy: [facility] [address]  Phone: [phone] Fax: [phone]

## 2008-05-21 NOTE — Telephone Encounter (Signed)
Called patient back. Still feeling sick. Bad cough. She had a bad night. She is feeling nausea and have abd pain and low grad temps. She would like to be seen. Appointment made.

## 2008-05-21 NOTE — Progress Notes (Signed)
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  Per pt, was seen at [facility] 2-3 weeks ago with cough and fever and was dx with bronchitis.  Given z-pac and prednisone taper.  Breathing better, coughing on and off.  Still getting fevers on and off.  Yesterday evening started with abd pain- epigastric pain and nausea.  Was 12/10 on pain scale.  + nausea.  No emesis, no diarrhea, no hematochezia or melena.  Pain constant since yesterday.  Hasn't eaten since then.  Pain now 7/10 on pain scale.  Was thinking about going to ED last night and this am, but made appt instead.  Pain radiating to back.  + sweats.  No sob.  No chest pain except for a little while yesterday but it resolved.  + bad taste in mouth.  + fevers.  No dysuria.  No vag discharge.  No smoker.  Per pt, no hx high chol or HTN.  Dad with cad- first dx age 41.    Sug hx- +appendectomy, hysterectomy, c-sections.    Obj:  BP 126/100  Pulse 70  Temp (Src) 98.3 F (36.8 C) (Oral)  Resp 16  Wt 255 lb (115.667 kg)  SpO2 97%  LMP Hysterectomy  Pain Score: 6 (6/10)  Gen: laying on table- uncomfortable, holding basin.  Chest: CTA b/l no wrr   CV: S1S2 heard RRR no murmurs  Abd: + TTP with guarding epigastric, RUQ and LUQ.  + murphy's sign.  Belly soft + BS.      Extr: no edema LE b/l.    A/P:  Erica Arroyo is a 41 year old female with epigastric pain and hx fevers.  DDX includes cholecystitis, pancreatitis, gastro, gastric ulcer.  Given nature of pain, pt's exam and how she currently feels will send to ED for further w/o.  Daughter is accompanying her and will drive her.  Pt going [facility].  Called physician there to let them know she was coming.    Pt agrees to plan.

## 2008-05-22 LAB — CHG HEPATIC FUNCTION PANEL
ALANINE AMINOTRANSFERASE: 23 IU/L (ref 7–35)
ALBUMIN: 3.6 g/dl (ref 3.4–4.8)
ALKALINE PHOSPHATASE: 49 IU/L (ref 25–106)
ASPARTATE AMINOTRANSFERASE: 18 IU/L (ref 8–34)
BILIRUBIN DIRECT: 0.1 mg/dl (ref 0.0–0.2)
BILIRUBIN TOTAL: 0.4 mg/dl (ref 0.2–1.1)
INDIRECT BILIRUBIN: 0.3 mg/dl (ref 0.2–0.9)
TOTAL PROTEIN: 6.5 g/dl (ref 5.9–7.5)

## 2008-05-22 LAB — BLOOD COUNT COMPLETE AUTO&AUTO DIFRNTL WBC
BASOPHIL %: 0.8 % (ref 0.0–2.0)
EOSINOPHIL %: 2.9 % (ref 0.0–7.0)
HEMATOCRIT: 38.1 % (ref 36.0–48.0)
HEMOGLOBIN: 12.9 g/dl (ref 12.0–16.0)
LYMPHOCYTE %: 37.8 % (ref 13.0–39.0)
MEAN CORP HGB CONC: 33.8 g/dl (ref 32.0–36.0)
MEAN CORPUSCULAR HGB: 30 pg (ref 27.0–33.0)
MEAN CORPUSCULAR VOL: 88.8 fl (ref 80.0–100.0)
MEAN PLATELET VOLUME: 9.7 fl (ref 6.4–10.8)
MONOCYTE %: 7.7 % (ref 1.0–12.0)
NEUTROPHIL %: 50.8 % (ref 46.0–79.0)
PLATELET COUNT: 242 10*3/uL (ref 150–400)
RBC DISTRIBUTION WIDTH: 12.8 % (ref 11.5–14.3)
RED BLOOD CELL COUNT: 4.29 M/uL — ABNORMAL LOW (ref 4.50–5.10)
WHITE BLOOD CELL COUNT: 7.7 10*3/uL (ref 4.0–10.8)

## 2008-05-22 LAB — BASIC METABOLIC PANEL
ANION GAP: 5 mmol/L (ref 2–25)
BUN (UREA NITROGEN): 11 mg/dl (ref 6–20)
CALCIUM: 9 mg/dl (ref 8.6–10.3)
CARBON DIOXIDE: 32 mmol/L (ref 22–32)
CHLORIDE: 100 mmol/L — ABNORMAL LOW (ref 101–111)
CREATININE: 0.9 mg/dl (ref 0.4–1.2)
ESTIMATED GLOMERULAR FILT RATE: >60 mL/min (ref 60–116)
Glucose Random: 116 mg/dl (ref 74–160)
POTASSIUM: 4.7 mmol/L (ref 3.5–5.1)
SODIUM: 137 mmol/L (ref 135–144)

## 2008-05-22 LAB — PROTHROMBIN TIME
INR: 1 — ABNORMAL LOW (ref 2.0–3.5)
PROTHROMBIN TIME: 10.6 SECONDS (ref 9.5–11.5)

## 2008-05-22 LAB — H&P

## 2008-05-22 LAB — LIPASE: LIPASE: 34 U/L (ref 10–50)

## 2008-05-22 LAB — APTT: APTT: 27.5 SECONDS (ref 23.7–33.3)

## 2008-05-22 LAB — AMYLASE: AMYLASE: 86 U/L (ref 15–133)

## 2008-05-22 LAB — NM HIDA SCAN

## 2008-05-23 LAB — CHG HEPATIC FUNCTION PANEL
ALANINE AMINOTRANSFERASE: 18 IU/L (ref 7–35)
ALBUMIN: 3.3 g/dl — ABNORMAL LOW (ref 3.4–4.8)
ALKALINE PHOSPHATASE: 41 IU/L (ref 25–106)
ASPARTATE AMINOTRANSFERASE: 14 IU/L (ref 8–34)
BILIRUBIN DIRECT: 0.1 mg/dl (ref 0.0–0.2)
BILIRUBIN TOTAL: 0.3 mg/dl (ref 0.2–1.1)
INDIRECT BILIRUBIN: 0.2 mg/dl (ref 0.2–0.9)
TOTAL PROTEIN: 6 g/dl (ref 5.9–7.5)

## 2008-05-23 LAB — BLOOD COUNT COMPLETE AUTO&AUTO DIFRNTL WBC
BASOPHIL %: 0.8 % (ref 0.0–2.0)
EOSINOPHIL %: 3.1 % (ref 0.0–7.0)
HEMATOCRIT: 36.5 % (ref 36.0–48.0)
HEMOGLOBIN: 12.5 g/dl (ref 12.0–16.0)
LYMPHOCYTE %: 42.3 % — ABNORMAL HIGH (ref 13.0–39.0)
MEAN CORP HGB CONC: 34.2 g/dl (ref 32.0–36.0)
MEAN CORPUSCULAR HGB: 30.1 pg (ref 27.0–33.0)
MEAN CORPUSCULAR VOL: 87.9 fl (ref 80.0–100.0)
MEAN PLATELET VOLUME: 9.5 fl (ref 6.4–10.8)
MONOCYTE %: 7.9 % (ref 1.0–12.0)
NEUTROPHIL %: 45.9 % — ABNORMAL LOW (ref 46.0–79.0)
PLATELET COUNT: 246 10*3/uL (ref 150–400)
RBC DISTRIBUTION WIDTH: 12.8 % (ref 11.5–14.3)
RED BLOOD CELL COUNT: 4.16 M/uL — ABNORMAL LOW (ref 4.50–5.10)
WHITE BLOOD CELL COUNT: 7.8 10*3/uL (ref 4.0–10.8)

## 2008-05-23 LAB — AMYLASE: AMYLASE: 79 U/L (ref 15–133)

## 2008-05-23 LAB — LIPASE: LIPASE: 32 U/L (ref 10–50)

## 2008-05-24 LAB — BLOOD SUGAR FINGERSTICK (POINT OF CARE): FINGERSTICK GLUCOSE: 157 mg/dl (ref 74–160)

## 2008-05-24 LAB — GI OPERATIVE NOTE

## 2008-05-25 LAB — SURGICAL PATH SPECIMEN

## 2008-06-04 LAB — EMERGENCY ROOM NOTE

## 2008-06-08 ENCOUNTER — Ambulatory Visit (HOSPITAL_BASED_OUTPATIENT_CLINIC_OR_DEPARTMENT_OTHER): Payer: Commercial Managed Care - HMO | Admitting: Surgery

## 2008-06-14 ENCOUNTER — Ambulatory Visit (HOSPITAL_BASED_OUTPATIENT_CLINIC_OR_DEPARTMENT_OTHER): Payer: Commercial Managed Care - HMO | Admitting: Family Medicine

## 2008-06-15 LAB — SURG SPEC CLINIC NOTE

## 2008-06-24 LAB — DISCHARGE SUMMARY

## 2008-06-26 ENCOUNTER — Telehealth (HOSPITAL_BASED_OUTPATIENT_CLINIC_OR_DEPARTMENT_OTHER): Payer: Self-pay | Admitting: Family Medicine

## 2008-06-26 ENCOUNTER — Encounter (HOSPITAL_BASED_OUTPATIENT_CLINIC_OR_DEPARTMENT_OTHER): Payer: Self-pay | Admitting: Family Medicine

## 2008-06-26 ENCOUNTER — Telehealth (HOSPITAL_BASED_OUTPATIENT_CLINIC_OR_DEPARTMENT_OTHER): Payer: Self-pay

## 2008-06-26 DIAGNOSIS — E162 Hypoglycemia, unspecified: Secondary | ICD-10-CM | POA: Insufficient documentation

## 2008-06-26 HISTORY — DX: Hypoglycemia, unspecified: E16.2

## 2008-06-26 NOTE — Telephone Encounter (Deleted)
Staff Message copied by Leitha Schuller on Tue Jun 26, 2008 4:31 PM  ------   Message from: RIVERA, [place]   Created: Tue Jun 26, 2008 3:58 PM   Regarding: call back    Erica Arroyo [id number], 42 year old, female, Telephone Information:  Home Phone [phone]  Work Phone [phone]      Cleotis Lema NUMBER: [phone]  Cell phone:   Other phone:    Available times:    Patient's language of care: English    Patient does not need an interpreter.    Patient's Care Team:     Person calling on behalf of patient: Patient (self)    Calls today pt was seen at the [facility] she states that Dr. Loreta Ave spoke with Dr. Roger Shelter and was told that she would be seen tomorrow morning here at the clinic by Dr. Roger Shelter. There is no appts available. Pls check.    Patient's [facility]  Phone: [phone] Fax: [phone]

## 2008-06-26 NOTE — Telephone Encounter (Signed)
Pt calls for F/U appt.  Unable to book any appt for tomorrow with Dr Roger Shelter.

## 2008-06-26 NOTE — Telephone Encounter (Signed)
Call from Dr. Loreta Ave at [facility] - presented with dizziness, even in bed. H/H stable. Not orthostatic. She had checked BS at work and it was 133. BS dropped inpt overnight dramatically to 30's  Then was 60, 60, 55, 51 so admitted to ICU for D10 drip. Sulfony[place]a screen to be done as [place] works in [place], but the lab was not able to do this.     Needs insulinoma work-up, screening for use (abuse) of diabetic meds.    There are case studies on Prozac and Omeprazole that can cause hypoglycemia so advised to stop both for now, will follow sugars with glucometer and restart one at a time in a few days if sugars are stable. Will call to be seen this week. May need to see Dr. Kathyrn Sheriff as needs AM appt.   AG

## 2008-06-26 NOTE — Telephone Encounter (Signed)
Staff Message copied by Leitha Schuller on Tue Jun 26, 2008 4:35 PM  ------   Message from: RIVERA, [place]   Created: Tue Jun 26, 2008 3:58 PM   Regarding: call back    Nyree Applegate [id number], 42 year old, female, Telephone Information:  Home Phone [phone]  Work Phone [phone]      Cleotis Lema NUMBER: [phone]  Cell phone:   Other phone:    Available times:    Patient's language of care: English    Patient does not need an interpreter.    Patient's Care Team:     Person calling on behalf of patient: Patient (self)    Calls today pt was seen at the [facility] she states that Dr. Loreta Ave spoke with Dr. Roger Shelter and was told that she would be seen tomorrow morning here at the clinic by Dr. Roger Shelter. There is no appts available. Pls check.    Patient's [facility]erred [facility]acy[facility]  Phone: [phone] Fax: [phone]

## 2008-06-26 NOTE — Telephone Encounter (Signed)
Staff Message copied by Gigi Gin on Tue Jun 26, 2008 10:18 AM  ------   Message from: Stanford [place]   Created: Tue Jun 26, 2008 10:03 AM   Regarding: call back     Erica Arroyo [id number], 42 year old, female, Telephone Information:  Home Phone [phone]  Work Phone [phone]      Cleotis Lema NUMBER: [phone]  Best time to call back:   Cell phone:   Other phone:    Available times:    Patient's language of care: English    Patient does not need an interpreter.    Patient's PCP: Emiliano Dyer    Person calling on behalf of patient: Dr. Loreta Ave from [facility]     Calls today , dr. Man call regarding this pt is important that you call him back today his page # is [phone] , thank you .    Patient's Preferred [facility]acy:   [facility]  Phone: [phone] Fax: [phone]

## 2008-06-28 NOTE — Telephone Encounter (Signed)
Appt is on Sept 15 @11am . Called and left vm appt info.

## 2008-06-29 ENCOUNTER — Encounter (HOSPITAL_BASED_OUTPATIENT_CLINIC_OR_DEPARTMENT_OTHER): Payer: Self-pay | Admitting: Family Medicine

## 2008-06-29 DIAGNOSIS — R42 Dizziness and giddiness: Secondary | ICD-10-CM | POA: Insufficient documentation

## 2008-07-03 ENCOUNTER — Ambulatory Visit (HOSPITAL_BASED_OUTPATIENT_CLINIC_OR_DEPARTMENT_OTHER): Payer: Commercial Managed Care - HMO | Admitting: Family Medicine

## 2008-07-03 DIAGNOSIS — K2970 Gastritis, unspecified, without bleeding: Secondary | ICD-10-CM

## 2008-07-03 DIAGNOSIS — E162 Hypoglycemia, unspecified: Secondary | ICD-10-CM

## 2008-07-03 DIAGNOSIS — Z23 Encounter for immunization: Secondary | ICD-10-CM

## 2008-07-03 DIAGNOSIS — H8110 Benign paroxysmal vertigo, unspecified ear: Secondary | ICD-10-CM

## 2008-07-03 MED ORDER — METOCLOPRAMIDE HCL 10 MG PO TABS
ORAL_TABLET | ORAL | Status: AC
Start: 2008-07-03 — End: 2008-08-02

## 2008-07-03 MED ORDER — ZANTAC 150 MG PO TABS
ORAL_TABLET | ORAL | Status: DC
Start: 2008-07-03 — End: 2008-09-18

## 2008-07-03 NOTE — Progress Notes (Signed)
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 Making sure she's eating reguarly. Cont'd Prozac, but did stop the Prilosec.     # Dizziness - vertigo, room is spinning even when laying down. New for patient x 2 wks. Put on meclizine, but not helpful.   Agg: rolling over in bed and position changes getting up from seated position  ROS: No hearing changes or ringing noises. Associated with Nausea, no vomiting. No recent viral illness.     Having blurry changes.     BP 108/88   Pulse 80   Temp (Src) 98.4 F (36.9 C) (Oral)   Resp 18   Wt 251 lb (113.853 kg)   SpO2 97%   LMP Hysterectomy  Estimated Body mass index is 47.43 kg/(m^2) as calculated from:     Height of 5\' 1"  (1.549 m) as of 05/04/08     Weight of 251 lb (113.853 kg) as of this encounter  Gen: WDWN, NAD, comfortable  HEENT: EOMI, NCAT, clear conjunctiave, TM, nares. OP non-erythematous. no LAD.  Lungs: CTAB, no w/r/r  CV: RRR, no m/r/g  Abd: soft, NT, ND, +BS, no HSM, no CVA or rebound TTP  Ext: WWP, no c/c/e  Neuro: Dix-Halpike positive. Epply maneuvers worse with lying L lateral decubitus and sitting upright looking down to Left. NO nysgtamus.    Extended vitals:    Orthostatic Vitals   BP Pulse Position Site Cuff Size Time Date   106/98 68 Standing --- --- 12:12 PM 07/03/2008   108/98 64 Sitting --- --- 12:10 PM 07/03/2008   110/94 54 Supine --- --- 12:07 PM 07/03/2008     No repeat blood pressure data filed.  No peak flow data filed.  No pain information filed.    A/P: Pt is 42 year old F with vertigo s/p hospital stay for hypoglycemia. Plan:    V72.43F Eye Examination  (primary encounter diagnosis)  Comment: blurry vision  Plan: REFERRAL TO OPTOMETRY (INT)        Eye exam      535.543F Gastritis  Comment: stopped Omeprazole 2/2 hypoglyecmia  Plan: ZANTAC 150 MG OR TABS       386.11N BPPV (Benign Paroxysmal Positional Vertigo)  Comment: Dix-Halpike and Eppley manuevers positive. Meclizine not helpful. DDX: peripheral vertigo (BPPV). Very unlikley central or Meniere's or post viral.  Plan: METOCLOPRAMIDE HCL 10 MG OR TABS  F/u in 4-6 wks with PCP  If reglan not helpful, consider antihistamine or BZD for vertigo.    V04.81 Need for Prophylactic Vaccination and Inoculation Against Influenza  Comment: routine  Plan: IMMUNIZATION ADMIN SINGLE, RN, INFLUENZA VIRUS         VACCINE SPLIT VIRUS 3 YEARS + IM    251.2T Hypoglycemia  Comment: stable, FS 72-122   Plan: cont'd FS prn and eating regualrly  Await hospital d/c paper work before considering further w/u  Pt stopped omeprazole. Cont'd Prozac

## 2008-07-03 NOTE — Progress Notes (Signed)
Orthostatic Vitals   BP Pulse Position Site Cuff Size Time Date   106/98 68 Standing --- --- 12:12 PM 07/03/2008   108/98 64 Sitting --- --- 12:10 PM 07/03/2008   110/94 54 Supine --- --- 12:07 PM 07/03/2008     No repeat blood pressure data filed.  No peak flow data filed.  No pain information filed.

## 2008-07-03 NOTE — Progress Notes (Signed)
Influenza Vaccine Procedure      1. Has the patient received the information for the influenza vaccine? Yes, 07/03/08.    2. Does the patient have any of the following contraindications?  Allergy to eggs? No  Allergic reaction to previous influenza vaccines? No  Any other problems to previous influenza vaccines? No  Paralyzed by Guillain-Barre syndrome?  No  Currently pregnant? No  Current moderate or severe illness? No  Allergy to contact lens solution? No    3. The vaccine has been administered in the usual fashion and the patient/guardian was instructed to wait 20 minutes before leaving the building in the event of an allergic reaction:     Immunization information and VIS for flu vaccine(s) for 2008-2009 reviewed; verbal consent given by patient/guardian.

## 2008-07-05 ENCOUNTER — Encounter (HOSPITAL_BASED_OUTPATIENT_CLINIC_OR_DEPARTMENT_OTHER): Payer: Self-pay | Admitting: Family Medicine

## 2008-07-09 NOTE — Progress Notes (Addendum)
PRECEPTOR NOTE  I reviewed findings with resident, including the key elements of history and physical exam as described in resident's note.  I agree with the assessment and plan as described below.  Please see resident's note for further details.

## 2008-07-16 ENCOUNTER — Ambulatory Visit (HOSPITAL_BASED_OUTPATIENT_CLINIC_OR_DEPARTMENT_OTHER): Payer: Commercial Managed Care - HMO | Admitting: Ophthalmology

## 2008-07-16 DIAGNOSIS — H524 Presbyopia: Principal | ICD-10-CM

## 2008-07-28 ENCOUNTER — Other Ambulatory Visit (HOSPITAL_BASED_OUTPATIENT_CLINIC_OR_DEPARTMENT_OTHER): Payer: Self-pay | Admitting: Family Medicine

## 2008-07-28 DIAGNOSIS — F418 Other specified anxiety disorders: Principal | ICD-10-CM

## 2008-07-28 MED ORDER — LORAZEPAM 1 MG PO TABS
ORAL_TABLET | ORAL | Status: DC
Start: 2008-07-28 — End: 2008-08-21

## 2008-07-28 NOTE — Telephone Encounter (Signed)
FAX FROM PHARMACY

## 2008-07-28 NOTE — Telephone Encounter (Signed)
Pt on Ativan for anxiety - will refill. MWC

## 2008-08-10 ENCOUNTER — Ambulatory Visit: Payer: Self-pay | Admitting: Family Medicine

## 2008-08-13 ENCOUNTER — Encounter (HOSPITAL_BASED_OUTPATIENT_CLINIC_OR_DEPARTMENT_OTHER): Payer: Self-pay | Admitting: Family Medicine

## 2008-08-21 ENCOUNTER — Other Ambulatory Visit (HOSPITAL_BASED_OUTPATIENT_CLINIC_OR_DEPARTMENT_OTHER): Payer: Self-pay | Admitting: Family Medicine

## 2008-08-21 DIAGNOSIS — F418 Other specified anxiety disorders: Principal | ICD-10-CM

## 2008-08-21 NOTE — Telephone Encounter (Signed)
Patient was in [facility] ICU due to hypoglycemia, she needs lancets and test strips she is now using the one touch ultra meter, please include number on times patient is testing.. F/u appointment made with Dr. Roger Shelter for 10-23-08.

## 2008-08-21 NOTE — Telephone Encounter (Signed)
Lancets and test stripts-these are new  She tests three times a day and prn    These were given at the [facility] when admitted and given to md at last visit.    [phone] wk with any questions  She has been waiting because the pharmacy has faxed 3 times  But she is almost out of strips

## 2008-08-22 MED ORDER — FLUOXETINE HCL 20 MG PO CAPS
ORAL_CAPSULE | ORAL | Status: DC
Start: 2008-08-21 — End: 2008-08-22

## 2008-08-22 MED ORDER — FLUOXETINE HCL 20 MG PO CAPS
ORAL_CAPSULE | ORAL | Status: DC
Start: 2008-08-22 — End: 2008-10-23

## 2008-08-22 MED ORDER — LORAZEPAM 1 MG PO TABS
ORAL_TABLET | ORAL | Status: DC
Start: 2008-08-21 — End: 2009-02-13

## 2008-08-22 NOTE — Telephone Encounter (Signed)
Pt last seen by PCP on 6/09 when started SSRI for anxiety. Never had f/u appt for mood. Will have front desk call pt to schedule appt with me or red team.

## 2008-09-06 ENCOUNTER — Encounter (HOSPITAL_BASED_OUTPATIENT_CLINIC_OR_DEPARTMENT_OTHER): Payer: Self-pay | Admitting: Family Medicine

## 2008-09-07 ENCOUNTER — Encounter (HOSPITAL_BASED_OUTPATIENT_CLINIC_OR_DEPARTMENT_OTHER): Payer: Self-pay | Admitting: Family Medicine

## 2008-09-10 ENCOUNTER — Telehealth (HOSPITAL_BASED_OUTPATIENT_CLINIC_OR_DEPARTMENT_OTHER): Payer: Self-pay | Admitting: Family Medicine

## 2008-09-10 NOTE — Telephone Encounter (Signed)
Pt called.  Letter sent.

## 2008-09-18 ENCOUNTER — Encounter (HOSPITAL_BASED_OUTPATIENT_CLINIC_OR_DEPARTMENT_OTHER): Payer: Self-pay | Admitting: Family Medicine

## 2008-09-18 ENCOUNTER — Ambulatory Visit (HOSPITAL_BASED_OUTPATIENT_CLINIC_OR_DEPARTMENT_OTHER): Payer: BLUE CROSS/BLUE SHIELD | Admitting: Family Medicine

## 2008-09-18 VITALS — BP 128/82 | HR 88 | Temp 98.7°F | Resp 24

## 2008-09-18 DIAGNOSIS — J45909 Unspecified asthma, uncomplicated: Secondary | ICD-10-CM

## 2008-09-18 DIAGNOSIS — E162 Hypoglycemia, unspecified: Secondary | ICD-10-CM

## 2008-09-18 DIAGNOSIS — J209 Acute bronchitis, unspecified: Principal | ICD-10-CM

## 2008-09-18 HISTORY — DX: Unspecified asthma, uncomplicated: J45.909

## 2008-09-18 LAB — BLOOD SUGAR FINGERSTICK (POINT OF CARE): FINGERSTICK GLUCOSE: 93 mg/dl (ref 74–160)

## 2008-09-18 MED ORDER — ONE TOUCH ULTRA BONUS PACK VI STRP
ORAL_STRIP | Status: AC
Start: 2008-09-18 — End: 2009-03-19

## 2008-09-18 MED ORDER — CHERATUSSIN AC 100-10 MG/5ML PO SYRP
ORAL_SOLUTION | ORAL | Status: AC
Start: 2008-09-18 — End: 2008-10-19

## 2008-09-18 MED ORDER — FLUTICASONE PROPIONATE (INHAL) 110 MCG/ACT IN AERO
INHALATION_SPRAY | RESPIRATORY_TRACT | Status: DC
Start: 2008-09-18 — End: 2010-01-16

## 2008-09-18 MED ORDER — DUONEB 0.5-2.5 (3) MG/3ML IN SOLN
RESPIRATORY_TRACT | Status: DC
Start: 2008-09-18 — End: 2010-01-16

## 2008-09-18 MED ORDER — ALBUTEROL 90 MCG/ACT IN AERS
INHALATION_SPRAY | RESPIRATORY_TRACT | Status: DC
Start: 2008-09-18 — End: 2010-01-16

## 2008-09-18 MED ORDER — AZITHROMYCIN 250 MG PO TABS
ORAL_TABLET | ORAL | Status: AC
Start: 2008-09-18 — End: 2008-10-19

## 2008-09-18 MED ORDER — LANCET DEVICE MISC
Status: AC
Start: 2008-09-18 — End: 2009-03-19

## 2008-09-18 MED ORDER — PREDNISONE 10 MG PO TABS
ORAL_TABLET | ORAL | Status: AC
Start: 2008-09-18 — End: 2008-10-19

## 2008-09-18 NOTE — Progress Notes (Signed)
Cc: cough  S: 42yF here w/trouble breathing and cough.  Started having trouble breathing through the weekend and worsened last night.  Fever of 100.6 this am, took Tylenol.  Feels tight in her chest and congested.  Is coughing up greenish sputum. Feels very tired.  Tried a duoneb with minimal relief just prior to visit.  Was wheezing this am.   Had last episode of bronchitis at end of July.  Uses albuterol only, is not on maintenance therapy.  Is using it every 4hours for the last 4 days.   No abdominal pain or diarrhea.  Has HA with cough.  Took robitussin with minimal relief.  Has difficulty taking deep breath in due to pain in ribs.    No CP or chest pressure.    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]     Obesity [278.00J]     Hypoglycemia [251.2T]     Asthma [493.90T]      Current outpatient prescriptions prior to encounter:  ALBUTEROL 90 MCG/ACT IN AERS take 2 puffs every 4-6h as needed for SOB, wheeze Disp: 1 Rfl: 3   DUONEB 0.5-2.5 (3) MG/3ML IN SOLN 3 ML 4 TIMES DAILY as needed for SOB, wheeze Disp: 360 Rfl: 0   FLUTICASONE PROPIONATE (INHAL) 110 MCG/ACT IN AERO 1 puff INH BID Disp: 1 Rfl: 3   LORAZEPAM 1 MG OR TABS 1 TABLET PO once daily Disp: 30 Rfl: 2   FLUOXETINE HCL 20 MG OR CAPS 1 CAPSULE EVERY MORNING Disp: 30 Rfl: 2   IMITREX 100 MG OR TABS 1 TABLET 1 TIME ONLY Disp: 30 Rfl: 0       O" BP 128/82   Pulse 88   Temp 98.7 F (37.1 C)   Resp 24   SpO2 99%   PF 250 L/min   LMP Hysterectomy  Pain Score: 6 (6/10)  Gen: obese, tired, looks age  ENT: ears -TMI, Nose-no rhinorrhea, Throat-mildly erythematous  Neck: no lymphadenopathy  CV: RRR no murmurs  Resp: poor inspiratory effort, CTAB no r/r/w  Abd: obese, soft    A/P: 42yF with acute Bronchitis  466.0 Acute Bronchitis  (primary encounter diagnosis)  Comment: Started on ABX and prednisone burst x6d  Plan: CHERATUSSIN AC 100-10 MG/5ML OR SYRP,          AZITHROMYCIN 250 MG OR TABS, PREDNISONE 10 MG         OR TABS    251.2T Hypoglycemia  Plan: GLUCOSE RANDOM, BLOOD SUGAR FINGERSTICK (POINT         OF CARE), ONE TOUCH ULTRA BONUS PACK VI STRP,         LANCET DEVICE MISC    493.90T Asthma  Comment: Started on maintenance therapy  Plan: ALBUTEROL 90 MCG/ACT IN AERS, DUONEB 0.5-2.5         (3) MG/3ML IN SOLN, FLUTICASONE PROPIONATE         (INHAL) 110 MCG/ACT IN AERO    * Patient given AVS  * Discussed visit with patient and patient understands the course of the visit and all questions were answered.  * Call or return to clinic prn if these symptoms worsen or fail to improve as anticipated.  *  Patient discussed with Dr. Noel Gerold

## 2008-09-18 NOTE — Progress Notes (Signed)
Preceptor Note:    Patient discussed with resident.  Subjective asthma exacerbation, per Dr. Chales Abrahams, no wheezing on exam normal O2 sat. S/p nebs  Would not necessarily give systemic steroids with this pictrue, but agree with antibiotics, and steroids are a reasonable conservative approach.      Erica Arroyo. Noel Gerold, MD

## 2008-09-18 NOTE — Progress Notes (Signed)
Erica Arroyo is a 42 year old female seen urgently with exacerbation of asthma for 2 days. Wheezing is described as sudden onset X 2 days. Associated symptoms:productive cough, green sputum. Current asthma medications: Albuterol inhaler, and used a duo neb Rx 2 hours ago with fair results.   Patient is alert oriented color pale skin warm and dry, C/O of chest congestion when coughing, SOB on excerebration. Lung sounds wheezing throughout. Taking tylenol Q 4 hours, for fever, muscle aches.  Random glucose 93, patient has a hx of hypoglycemia.  ASSESSMENT:   Asthma exacerbation in no acute respiratory distress at this time.  Appointment schedule with MD for patient to be seen at 9:20 AM.

## 2008-09-24 ENCOUNTER — Telehealth (HOSPITAL_BASED_OUTPATIENT_CLINIC_OR_DEPARTMENT_OTHER): Payer: Self-pay | Admitting: Family Medicine

## 2008-09-24 NOTE — Telephone Encounter (Signed)
Staff Message copied by Raynelle Dick on Mon Sep 24, 2008 7:15 PM  ------   Message from: Fermin Schwab   Created: Mon Sep 24, 2008 6:53 PM   Regarding: app for tomorrow    Erica Arroyo [id number], 42 year old, female, Telephone Information:  Home Phone [phone]  Work Phone Not on file.      Cleotis Lema NUMBER: [phone]  Best time to call back: -  Cell phone:   Other phone:    Available times:    Patient's language of care: English    Patient does not need an interpreter.    Patient's PCP: Consepcion Hearing, MD    Person calling on behalf of patient: Patient (self)    Calls today with a sick call. Patient hasn't been feeling well since last time she was here. SHe would like to come in tomorrow for app.    Patient's [facility]erred [facility]   [facility]  Phone: [phone] Fax: [phone]

## 2008-09-24 NOTE — Telephone Encounter (Signed)
Spoke w/ pt.  She is not felling better since she was seen on 09/18/08.  She finished the Prednisone and z pak.  Please call, please advise.

## 2008-09-25 ENCOUNTER — Encounter (HOSPITAL_BASED_OUTPATIENT_CLINIC_OR_DEPARTMENT_OTHER): Payer: Self-pay

## 2008-09-25 ENCOUNTER — Ambulatory Visit (HOSPITAL_BASED_OUTPATIENT_CLINIC_OR_DEPARTMENT_OTHER): Payer: BLUE CROSS/BLUE SHIELD

## 2008-09-25 VITALS — BP 124/82 | HR 82 | Temp 98.8°F | Wt 231.0 lb

## 2008-09-25 DIAGNOSIS — Z72821 Inadequate sleep hygiene: Secondary | ICD-10-CM

## 2008-09-25 DIAGNOSIS — J45901 Unspecified asthma with (acute) exacerbation: Principal | ICD-10-CM

## 2008-09-25 DIAGNOSIS — F329 Major depressive disorder, single episode, unspecified: Secondary | ICD-10-CM

## 2008-09-25 DIAGNOSIS — F32A Depression, unspecified: Secondary | ICD-10-CM

## 2008-09-25 MED ORDER — PREDNISONE 10 MG PO TABS
ORAL_TABLET | ORAL | Status: DC
Start: 2008-09-25 — End: 2008-10-23

## 2008-09-25 MED ORDER — ALBUTEROL SULFATE (5 MG/ML) 0.5% IN NEBU
INHALATION_SOLUTION | RESPIRATORY_TRACT | Status: DC
Start: 2008-09-25 — End: 2010-01-16

## 2008-09-25 MED ORDER — FLUOXETINE HCL 40 MG PO CAPS
ORAL_CAPSULE | ORAL | Status: DC
Start: 2008-09-25 — End: 2008-11-30

## 2008-09-25 NOTE — Progress Notes (Signed)
Erica Arroyo is a 42 year old female pt of Consepcion Hearing, MD with   Past Medical History    Hypertension     Asthma 09/18/2008    Comment: Started on Flovent 12/09, albuterol prn    Hypoglycemia 06/26/2008    Comment: 9/09: s/p ICU stayf or hypoglycemia. Needs insulinoma work-up, screening for use (abuse) of diabetic meds.   There are case studies on Prozac and Omeprazole that can cause hypoglycemia so advised to stop both for now, will follow sugars with glucometer and restart one at a time in a few days if sugars are stable. Will call to be seen this week.     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 Last CXR 3/06 (-) ?allergy to injection caused positive test.    Depression         Current outpatient prescriptions prior to encounter:  ALBUTEROL SULFATE (5 MG/ML) 0.5% IN NEBU treatment now Disp: 1 Rfl: 0   ALBUTEROL 90 MCG/ACT IN AERS take 2 puffs every 4-6h as needed for SOB, wheeze Disp: 1 Rfl: 3   CHERATUSSIN AC 100-10 MG/5ML OR SYRP 2 TEASPOONSFUL at Bedtime Disp: Rfl: 0   ONE TOUCH ULTRA BONUS PACK VI STRP Check sugars three times daily Disp: 120 Rfl: 3   LANCET DEVICE MISC check sugars three times daily Disp: 120 Rfl: 3   AZITHROMYCIN 250 MG OR TABS 2 tablets today, then 1 tablet daily on days 2-5 Disp: 6 Rfl: 0   PREDNISONE 10 MG OR TABS 6 tabs today, 5 tabs day 2, 4 tabs day 3, 3 tabs day 4, 2 tabs day 5, 1 tab day 6 Disp: 21 Rfl: 0   DUONEB 0.5-2.5 (3) MG/3ML IN SOLN 3 ML 4 TIMES DAILY as needed for SOB, wheeze Disp: 360 Rfl: 0   FLUTICASONE PROPIONATE (INHAL) 110 MCG/ACT IN AERO 1 puff INH BID Disp: 1 Rfl: 3   LORAZEPAM 1 MG OR TABS 1 TABLET PO once daily Disp: 30 Rfl: 2   FLUOXETINE HCL 20 MG OR CAPS 1 CAPSULE EVERY MORNING Disp: 30 Rfl: 2   IMITREX 100 MG OR TABS 1 TABLET 1 TIME ONLY  Disp: 30 Rfl: 0      Patient presents with:     Cough    Trial of albuterol: more tremor, no increase in air movement, no wheeze, no rales, same wheezy dry cough.    Imp/Plan:  Edema/inflammation more so than smooth muscle mediated bronchoconstriction causing coarse breath sounds c poor airmovement.  Anxiety worsens stress worsens anxiety - and probably leads to reduced sleep & inreased depression.  Insuffic sleep.  Make sleep a priority: one hour from arriving home to being asleep in bed, and one hour from arising to get out of house.  Set goal to increase nightly sleep from 5 to 6 h.  This is not the end goal I have in mind, but she was looking a little discouraged and overwhelmed.  Let adolescent daughter make her own food sometimes.  Pt requests increase in fluoxetine dose as she is under more stressors and believes it helps.  OK.    I interviewed and examined the patient and discussed the findings and management with the resident.  I agree with the  plan as discussed.

## 2008-09-25 NOTE — Progress Notes (Signed)
SUBJECTIVE: follow up    1. Difficulty Breathing:  -Symptoms x 2 weeks. Evaluated in clinic, given Flovent/DuoNebs/Zpack/fast Prednisone taper  -feeling slightly better but still c/o SOB  -Finished prednisone 4 days ago. Some improvement in breathing initially, but declined again once off prednisone  -Peak flow <300  -No wheezing, but feels tight, as if "lungs on fire"  -Dry cough, green sputum resolved- worse at night  -No rhinorrhea/congestion  -No ear or throat pain  -Albuterol once per day, Flovent BID, Duonebs TID- helps minimally  -T 100.1 on weekend, no fevers since  -No myalgias  -Ribs hurt from coughing  -Had flu shot, had pneumo shot  -Works in [place]  -No known sick contacts  -hx asthma but previously well controlled and only acted up minimally with URI  -nonsmoker    2. Depression: on prozac. Multiple stressors including job, lack of sleep, young children, finances. Requests increase in dose of prozac, tolerating well. Not seeing a counselor or anything but thinks may be helpful. Denies SI/HI    3. Lack of sleep:  -only 4-5 hours per night because of work shift and taking care of children  -hard time falling asleep because worried about everything  -"type A" needs to do everything for everyone  -fatigued all day  -minimal caffeine    PHYSICAL EXAM  BP 124/82   Pulse 82   Temp (Src) 98.8 F (37.1 C) (Oral)   Wt 104.781 kg (231 lb)   SpO2 95%   LMP Hysterectomy   GEN: well appearing, NAD  HEENT: NCAT, PERRL, EOMI, MMM  NECK: FROM, no palpable lymph nodes  CHEST: Initially no wheezing or abnl lung sounds but poor air movement diffusely and tight. Wheezy dry cough. Given neb treatment which did not help much. Still not moving great air, no wheezing. Cough minimally improved. Became tremulous with albuterol.  HEART: RRR, no murmurs  ABD: BS present, soft NTND, no masses or hernias  NEURO: Grossly intact  EXT: no edema    ASSESSMENT: continued asthma exacerbation, depression, insomina    PLAN:  493.92C Asthma  with Exacerbation  (primary encounter diagnosis)  Comment: still with asthma exacerbation. Didn't really open up with albuterol and became tremulous so asthma sx not likely related to smooth muscle spasm. More likley related to inflammation and edema of airways. Did get better with prednisone and worsened when fast taper over. Hasnt improved much with nebs. NAD and oxygenating ok. Asthma likely triggered by URI.  Plan: ALBUTEROL SULFATE (5 MG/ML) 0.5% IN NEBU,         PREDNISONE 10 MG OR TABS  -counseled re pathophysiology of asthma  -will restart prednisone and give long slow taper to help reduce inflammation. Counseled re risks versus benefits of prednisone. Pt has taken before, it makes her hungrier. Believes benefits outweight risks and ok restarting  -continue flovent BID  -doesn't really need to use Duonebs as no COPD, likely not much smooth muscle involvment so ipratropium likley not adding much  -can continue Albuterol prn. Will use only prn as does make her a little jittery. Not tachycardic on it though  -will call or go to ED if breathing further decompensates, if any other symptoms, if diffuse wheezing etc  -already treated with antibiotics, not likely pneumonia or infection    311F Depression  Comment: contracts for safety but feels like has multiple stressors nad would like increase in Prozac  Plan: FLUOXETINE HCL 40 MG OR CAPS  -increase Prozac to 40 mg. Take  2 pills of what she has then new rx at pharmacy  -contracts for safety. Will call if this changes.  -pt will call insurance and look into what counselors are covered in the area and set something up    307.49S Poor Sleep Hygiene  Comment: lack of sleep likely contributing to patients depression/anxiety and her slow improvement  Plan:  -counseled re sleep hygeine: avoid caffeine, bed time routine, go to bed within 1 hour of getting home, letting daugther have some responsibility so she doesn't have to come home from work then do multiple chores,  deep breathing.    Follow up as scheduled in 1 mo. Patient in agreement with above plan. Given copy of AVS. Will call with any concerns.

## 2008-09-25 NOTE — Telephone Encounter (Signed)
Pt states she still doesn't feel well.  A little better, but not great.  Wants to be seen.  Told her to call to make apt today or tomorrow.  Pt agreed with plan.

## 2008-10-23 ENCOUNTER — Ambulatory Visit (HOSPITAL_BASED_OUTPATIENT_CLINIC_OR_DEPARTMENT_OTHER): Payer: HMO | Admitting: Family Medicine

## 2008-10-23 VITALS — BP 102/60 | HR 86 | Temp 98.7°F | Ht 61.0 in | Wt 229.0 lb

## 2008-10-23 DIAGNOSIS — F329 Major depressive disorder, single episode, unspecified: Principal | ICD-10-CM

## 2008-10-23 DIAGNOSIS — F32A Depression, unspecified: Secondary | ICD-10-CM

## 2008-10-23 LAB — BLOOD SUGAR FINGERSTICK (POINT OF CARE): FINGERSTICK GLUCOSE: 102 mg/dl (ref 74–160)

## 2008-10-23 MED ORDER — FLUOXETINE HCL 20 MG PO CAPS
ORAL_CAPSULE | ORAL | Status: DC
Start: 2008-10-23 — End: 2009-02-13

## 2008-10-23 NOTE — Patient Instructions (Addendum)
Pedometer - 10,000 steps per day is goal for weight loss.   See how many you average a day for one week.   Try to go up by 100 steps/day each week.     Try journaling - vent onto paper.    Consider learning meditation  Video game - Journey to the [employer] - what is the final deal-breaker with your daughter?     Call behavioral health number - help with coping techniques    Book - Stress and relaxation workbook   Meditation - Alonna Buckler    Karleen Hampshire - "Wherever you go, there you are"    Tai chi, martial arts, yoga - forms of moving meditation    Practice relaxation techniques with a smell associated.

## 2008-10-23 NOTE — Progress Notes (Signed)
43 year old F her concerned about depression. She is not diabetic. Had an episode on Labor day weekend - sugar dropped  - ICU  Thought due to Prilosec and Prozac - off Prilosec now.     First dx'd with Depr at age 77 (after bro killed in car accident), Pt previously dx'd with OCD, ADD. 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 Has managed with diet. Feels depression tends to be situational. Many stresses now:   Daughter bipolar, pregnant, noncompliant with meds. She and husband live with pt. Now father just passed away.   2 other kids - 20 and 56, younger lives with her.  Doesn't talk to eldest.   Began Prozac in summer for OCD and depression ( boss felt OCD problem). Prozac helping but not as well as she would like it to.   Divorced.     Sleeps well - only 5 hours a day during week, more on weekends. Energy is good. No SI/HI. Losing weight intentionally. (Goal 140 lbs)   Going up to 40 mg helped with OCD sx - able to check things less, make fewer lists at work (Aggravating to supervisor, co-workers)   PMH, PSH, meds, allergies, FH, Soc hx reviewed and chart updated.    ROS:   -Breathing has been fine since she was treated. Tapered off Prednisone.     -Had postional vertigo since Labor day.  - On and off, can be walking or sitting. No hearing loss, buzzing. Worst when first out of bed. Had Epply maneuvers done and has tried exercises at home but still have episodes.     On exam, WDWN F in NAD, A+Ox3, affect appropriate.  Normal gait, steady.   BP 102/60  Pulse 86  Temp (Src) 98.7 F (37.1 C) (Oral)  Ht 5\' 1"  (1.549 m)  Wt 229 lb (103.874 kg)  SpO2 100%  LMP Hysterectomy    A/P:  1) Depression  - spent with pt with more than half in face to face counseling about stressors, treatment options, relaxation techniques, exercise. Pt would like to try increasing Fluoxetine dose but agrees to see psych to consider other meds if not working.  Will  look into counseling now through [facility] number from her insurance co.     F/u in one month, address vertigo then.

## 2008-10-25 ENCOUNTER — Encounter (HOSPITAL_BASED_OUTPATIENT_CLINIC_OR_DEPARTMENT_OTHER): Payer: Self-pay | Admitting: Family Medicine

## 2008-11-08 ENCOUNTER — Encounter (HOSPITAL_BASED_OUTPATIENT_CLINIC_OR_DEPARTMENT_OTHER): Payer: Self-pay | Admitting: Family Medicine

## 2008-11-08 NOTE — Progress Notes (Signed)
Patient came in today very upset because she says that she has not heard back from [place] regarding an appt with  Dr Roger Shelter for one month.    The AVS had stated a 40 minute appt- but Dr Jake Seats  next 40 minute appt is not until the 30th of March.   Pt  wants to know why Dr Roger Shelter  would say to come back in one month for 40 minutes if she didn't have an opening for 40 minutes.   She is also refusing to see anyone but Dr Roger Shelter.   She has not liked the treatment she has received here at the clinic from the beginning.   She asked for the name of someone to file a complaint with- I gave her patient relations- Erica Arroyo.

## 2008-11-21 ENCOUNTER — Ambulatory Visit (HOSPITAL_BASED_OUTPATIENT_CLINIC_OR_DEPARTMENT_OTHER): Payer: HMO | Admitting: Family Medicine

## 2008-11-21 VITALS — BP 120/86 | HR 80 | Temp 98.9°F | Resp 16 | Wt 220.0 lb

## 2008-11-21 DIAGNOSIS — F329 Major depressive disorder, single episode, unspecified: Principal | ICD-10-CM

## 2008-11-21 DIAGNOSIS — J069 Acute upper respiratory infection, unspecified: Secondary | ICD-10-CM

## 2008-11-21 DIAGNOSIS — F32A Depression, unspecified: Secondary | ICD-10-CM

## 2008-11-21 NOTE — Patient Instructions (Addendum)
Remember that you have choices.    Consider what you can control in 3 areas:   1) What you feel and think  2)What you do  3) What your daughter and her husband do      Things for yourself:     Quilting   Crocheting   Exercise        Lightbox - use for about an hour at the beginning your "day". Put where you can see it out of the corner of your eye, keep glancing at it.     Breathing - do 3 part  Breathe in for 4, hold for 7, out for 8    Or do breathing while repeating a phrase or word. See thioughts as trains going by - you don't have to get on, just observe  Do this or muscle relaxation with a scent, then use scent when stressed    Fitday.com - keep track of foods, carbs/protein  Eat at least every 3-4 hours.- carry foods like Larabars, hard boiled eggs, string cheese, crackers with PB    Bach flower remedies - look at rescue remedy

## 2008-11-21 NOTE — Progress Notes (Signed)
43 year old F to f.u on depression also with nasal congestion for last week, temp to 100.1, no resp sx  Quit most stressful job - still has another job at [employer]. Has noted that mood definitely always worse in winter, dark times of year.     Daughter is 21 w preg, bipolar, schizoaffective. She and her husband live with pt, husband very little help, ? Cognitively impaired  Other 2 childre are 60 and 44 years old - had to have daughter go live with father and she was too disruptive,    Managing stress by going to gym every day - 1/2 hr cardio, circuit training  Increase fluoxeine helped mood -wants to continue.   Catches up on sleep on weekends. Gets 6-8 hours of sleep weeknights.    PMH, PSH, meds, allergies, FH, Soc hx reviewed and chart updated.  ROS- no skin changes, CP, SOB, numbness, tingling, n/v/d/c    On exam, WDWN F in NAD, A+Ox3, affect appropriate.  BP 120/86   Pulse 80   Temp (Src) 98.9 F (37.2 C) (Oral)   Resp 16   Wt 220 lb (99.791 kg)   SpO2 96%   LMP Hysterectomy   Face nontender over sinuses other than mild tenderness L maxillary.   Nose with clear D/c.   OP clear   Neck supple without lymphadenopathy.  Lungs CTA bilat   Cor RRR  Ext  No c/c/e  A/P:  1) Depression - discussed home situation at length. Pt feels she may end up raising this grandson but chooses to do so. Discussed coping mechanisms. Continue fluoxetine, exercise. Taught breathing exercises (see pt instructions) Will loan light box for trial - if helpful will consider getting one.   2) URI - discussed OTC meds, nasal washings, reasons to be seen that might indicate bacterial infection.   F/u 1 month.   60 min spent with pt with more than half in face to face counseling about depression and educating on relaxation techniques.

## 2008-11-23 ENCOUNTER — Encounter (HOSPITAL_BASED_OUTPATIENT_CLINIC_OR_DEPARTMENT_OTHER): Payer: Self-pay | Admitting: Family Medicine

## 2008-11-30 ENCOUNTER — Other Ambulatory Visit (HOSPITAL_BASED_OUTPATIENT_CLINIC_OR_DEPARTMENT_OTHER): Payer: Self-pay

## 2008-12-04 NOTE — Telephone Encounter (Signed)
Done.  AG

## 2008-12-27 ENCOUNTER — Ambulatory Visit (HOSPITAL_BASED_OUTPATIENT_CLINIC_OR_DEPARTMENT_OTHER): Payer: HMO | Admitting: Family Medicine

## 2008-12-27 ENCOUNTER — Encounter (HOSPITAL_BASED_OUTPATIENT_CLINIC_OR_DEPARTMENT_OTHER): Payer: Self-pay | Admitting: Family Medicine

## 2008-12-27 ENCOUNTER — Telehealth (HOSPITAL_BASED_OUTPATIENT_CLINIC_OR_DEPARTMENT_OTHER): Payer: Self-pay | Admitting: Ambulatory Care

## 2008-12-27 VITALS — BP 115/82 | HR 95 | Temp 99.0°F | Resp 20 | Wt 224.0 lb

## 2008-12-27 DIAGNOSIS — J069 Acute upper respiratory infection, unspecified: Principal | ICD-10-CM

## 2008-12-27 MED ORDER — FLONASE 50 MCG/DOSE NA INHA
NASAL | Status: AC
Start: 2008-12-27 — End: 2009-01-27

## 2008-12-27 NOTE — Telephone Encounter (Signed)
Staff Message copied by Vinetta Bergamo on Thu Dec 27, 2008 10:48 AM  ------   Message from: Prue, [place]   Created: Thu Dec 27, 2008 10:20 AM   Regarding: need to be see dr. today for fever   Contact: [phone]    Nija Koopman [id number], 43 year old, female, Telephone Information:  Home Phone [phone]  Work Phone Not on file.      Cleotis Lema NUMBER: [phone]  Best time to call back:   Cell phone:   Other phone:    Available times:    Patient's language of care: English    Patient does not need an interpreter.    Patient's PCP: Emiliano Dyer    Person calling on behalf of patient: Patient (self)    Calls today with questions and concerns. Pls' call her back    Patient's [facility]   [facility]  Phone: [phone] Fax: [phone]

## 2008-12-27 NOTE — Progress Notes (Signed)
PRECEPTOR NOTE   I personally reviewed patient's history and exam findings with resident Dr. Gupta.  I confirm the key elements of history and physical exam as described in resident's note.  I agree with the assessment and plan as described by resident.  Please see resident's note for further details.

## 2008-12-27 NOTE — Progress Notes (Signed)
Cc: cold  S: 42yF presents with cold symptoms.      Nasal congestion x2wk.  Feels pain on right side more due to deviated septum.  Fever started Monday. Fever 102 max. Vomited 3x yesterday. No diarrhea.   Took sudafed last night.  Taking tylenol around the clock.  Using ocean nasal spray.  Chest congestion but only mild cough. Sore throat Monday, tues then resolved.   No SOB, CP.  Mild HA.  No blurry vision.   Ears hurt L>R.  No discharge from ears.  No recent travel.    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    I have reviewed the past medical, surgical, social and family history and updated these sections of EpicCare as relevant. All interim labs, test results, and consult notes were reviewed and discussed with Erica Arroyo. Medications were reconciled during this visit and a current medication list was given to the patient at the end of the visit.    O: BP 115/82   Pulse 95   Temp (Src) 99 F (37.2 C) (Oral)   Resp 20   Wt 224 lb (101.606 kg)   SpO2 100%   LMP Hysterectomy  Pain Score: 3 (3/10)  Most Recent BP Reading(s)     Date:        BP:     12/27/2008   146/100     11/21/2008   120/86     10/23/2008   102/60  Gen: pleasant, comfortable  ENT: Ears-TMI mild erythema of left external canal, Nose-erythematous very swollen turbinates w/clear rhinorrhea; Throat-midly erythematous  Neck: mild tender anterior cervical lymphadenopathy.  CV: RRR no murmurs  Resp: CTAB no r/r/w  Abd: +bs, soft, ND/NT  Ext: w/w/p    A/P: 42yF with viral URI  * Viral URI: no evidence of sinus infection, otitis, may be developing a gastroenteritis   - Rx for Flonase to help with nasal swelling.  Use after ocean nasal spray   - cont supportive meds: tylenol prn fever, sudafed in am for congestion.   - counseled on supportive measures including: humidifier, push fluids, rest, nasal saline for congestion, tsp honey for cough.   - counseled on anticipatory guidance in case of develop fevers give tylenol as needed.  Monitor for decreased intake, urine output; lethargy, retractable fevers, sx >7d    * Patient given AVS  * Discussed visit with patient and patient understands the course of the visit and all questions were answered.  * Call or return to clinic prn if these symptoms worsen or fail to improve as anticipated. Has scheduled apt in 2wk.  *  Patient discussed with Dr. Roger Shelter

## 2008-12-27 NOTE — Telephone Encounter (Signed)
Called patient back. She said she has been sick since Monday. A fever of 102. Her neck hurts , no energy, her ears hurt.  Appointment made.

## 2009-01-08 ENCOUNTER — Encounter (HOSPITAL_BASED_OUTPATIENT_CLINIC_OR_DEPARTMENT_OTHER): Payer: Self-pay | Admitting: Family Medicine

## 2009-01-15 ENCOUNTER — Ambulatory Visit (HOSPITAL_BASED_OUTPATIENT_CLINIC_OR_DEPARTMENT_OTHER): Payer: HMO | Admitting: Family Medicine

## 2009-01-22 ENCOUNTER — Encounter (HOSPITAL_BASED_OUTPATIENT_CLINIC_OR_DEPARTMENT_OTHER): Payer: Self-pay | Admitting: Family Medicine

## 2009-01-24 ENCOUNTER — Encounter (HOSPITAL_BASED_OUTPATIENT_CLINIC_OR_DEPARTMENT_OTHER): Payer: Self-pay | Admitting: Family Medicine

## 2009-01-30 ENCOUNTER — Telehealth (HOSPITAL_BASED_OUTPATIENT_CLINIC_OR_DEPARTMENT_OTHER): Payer: Self-pay | Admitting: Family Medicine

## 2009-01-30 NOTE — Telephone Encounter (Signed)
Pt's number not taking messages. Have tried before - no answer, only that message on machine.   Left message at emergency contact number to ask him to have pt call me. She has Lightbox I loaned her which is now overdue to be returned. She needs to return it to me or buy it.   AG

## 2009-02-05 NOTE — Telephone Encounter (Signed)
Left message on [phone]  (prev typed wrong). Asked pt to call me re: return light box or we will be charged.   AG

## 2009-02-05 NOTE — Telephone Encounter (Signed)
Pt returning phone call. Pls call back at [phone]

## 2009-02-05 NOTE — Telephone Encounter (Signed)
Spoke with emergency contact -he said he will ask pt to call me.   AG

## 2009-02-06 NOTE — Telephone Encounter (Signed)
Can you help pt to get this appt? Thanks,   Sue Lush

## 2009-02-06 NOTE — Telephone Encounter (Signed)
Spoke with pt - stressed due to daughter's high risk pregnancy. (due in 6 weeks)   Fluoxetine helping some but feels she needs something more. Has been using Ativan more often.     Both shoulder, back of neck very stiff -can barely reach over her head. Has been going to the gym so stopped but getting worse. Tightens up during the day.    Patient believed she had appt next week but that has been cancelled. Will ask her to make appt with Dr. Chales Abrahams to address shoulder pain and refill meds if she feels it's appropriate.     Front desk - please make appt for pt for Dr. Chales Abrahams Wed. 4/28 for 40 min.

## 2009-02-06 NOTE — Telephone Encounter (Signed)
Appt 4/28 w/Gupta 40 mins

## 2009-02-12 ENCOUNTER — Ambulatory Visit (HOSPITAL_BASED_OUTPATIENT_CLINIC_OR_DEPARTMENT_OTHER): Payer: HMO | Admitting: Family Medicine

## 2009-02-13 ENCOUNTER — Ambulatory Visit (HOSPITAL_BASED_OUTPATIENT_CLINIC_OR_DEPARTMENT_OTHER): Payer: HMO | Admitting: Family Medicine

## 2009-02-13 ENCOUNTER — Encounter (HOSPITAL_BASED_OUTPATIENT_CLINIC_OR_DEPARTMENT_OTHER): Payer: Self-pay | Admitting: Family Medicine

## 2009-02-13 VITALS — BP 124/80 | HR 76 | Temp 98.3°F | Wt 231.0 lb

## 2009-02-13 DIAGNOSIS — M9901 Segmental and somatic dysfunction of cervical region: Secondary | ICD-10-CM

## 2009-02-13 DIAGNOSIS — M9900 Segmental and somatic dysfunction of head region: Secondary | ICD-10-CM

## 2009-02-13 DIAGNOSIS — F418 Other specified anxiety disorders: Secondary | ICD-10-CM

## 2009-02-13 DIAGNOSIS — F329 Major depressive disorder, single episode, unspecified: Principal | ICD-10-CM

## 2009-02-13 DIAGNOSIS — M9902 Segmental and somatic dysfunction of thoracic region: Secondary | ICD-10-CM

## 2009-02-13 DIAGNOSIS — M9907 Segmental and somatic dysfunction of upper extremity: Secondary | ICD-10-CM

## 2009-02-13 DIAGNOSIS — F32A Depression, unspecified: Secondary | ICD-10-CM

## 2009-02-13 MED ORDER — FLUOXETINE HCL 40 MG PO CAPS
ORAL_CAPSULE | ORAL | Status: AC
Start: 2009-02-13 — End: 2009-04-15

## 2009-02-13 MED ORDER — LORAZEPAM 1 MG PO TABS
ORAL_TABLET | ORAL | Status: DC
Start: 2009-02-13 — End: 2010-01-08

## 2009-02-13 NOTE — Progress Notes (Signed)
.  .  .  Cc: anxiety and shoulder pain  S: 42yF here for worsening anxiety and shoulder pain.    Has been having shoulder and upper back pain for last 6wk.  Was going to the gym regularly but no known injuries.  No h/o traumas. Has tried ibuprofen and tylenol, hot/cold packs with minimal relief. Hurts to do stretching.  Pain in neck, upper shoulders and upper to mid back.  Feels like a severe "ache."  Has limited ranges of motion with her arm.    She has been having worsening anxiety and stress due to her daughter who is having a high risk pregnancy.  She has been on prozac 60mg  daily for the last 6wk or so.  She does feel like the medication is helping but not optimized.  She denies any side effects of the medication.    She is upset that her daughter is irresponsible and the son-in-law is not as involved or dependable as she would expect.  She has been spending time with her daughter in the hospital and finds that even when she is at home, she has trouble sleeping with her daughter around.  When she goes to a friend's house, she is able to sleep much better due to lack of stress there.  She states she has tried the "liquids" natural medication for anxiety as discussed with Dr. Roger Shelter, but with minimal relief.  She states she also does try to do some breathing relaxation but finds it hard to get the time for herself.    Review of Patient's Allergies indicates:   Amoxicillin             Hives   Penicillins             Rash   Tetracycline            Rash   Meperidine hcl          Rash   Hydromorphone hcl 1     Rash   Ppd                     Hives    Comment: And sloughing of the skin    Current outpatient prescriptions prior to encounter:  ALBUTEROL SULFATE (5 MG/ML) 0.5% IN NEBU treatment now Disp: 1 Rfl: 0   ALBUTEROL 90 MCG/ACT IN AERS take 2 puffs every 4-6h as needed for SOB, wheeze Disp: 1 Rfl: 3   ONE TOUCH ULTRA BONUS PACK VI STRP Check sugars three times daily Disp: 120 Rfl: 3   LANCET DEVICE MISC check  sugars three times daily Disp: 120 Rfl: 3   DUONEB 0.5-2.5 (3) MG/3ML IN SOLN 3 ML 4 TIMES DAILY as needed for SOB, wheeze Disp: 360 Rfl: 0   FLUTICASONE PROPIONATE (INHAL) 110 MCG/ACT IN AERO 1 puff INH BID Disp: 1 Rfl: 3   IMITREX 100 MG OR TABS 1 TABLET 1 TIME ONLY Disp: 30 Rfl: 0       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  Hypertonicity of paravertebrals R>L  Thoracic: significant hypertonicity of upper paravertebrals bilaterally.  Decreased thoracic kyphosis. Tenderness on palpation of paravertebrals T1-T5. T4 rotated L, sidebent L.  Shoulders: Right shoulder and scapula elevated. Limited in ABduction bilat. Limited internal rotation bilat. Full flexion, limited extension.  MENTAL STATUS EXAM:  Appearance: clean, hygenic, anxious  Attitude: negative but complacent  Mood/Affect: appropriate  Orientation: alert and oriented  Speech: appropriate  Thought Content: appropriate  Thought Process: appropriate  SI/HI: denies  Insight/Judgment: appropriate    A/p: 42yF with depression/anxiety and somatic dysfunctions  311F Depression  (primary encounter diagnosis)  Comment: worsened recently due to stress at home with daughter  Plan:  - increased dose of Prozac to 80 daily   - counseled on s/s of concern from depression and medication side effects    300.4CQ Anxiety Associated with Depression  Comment: likely worsened recently due to stress with daughter's pregnancy  Plan: LORAZEPAM 1 MG OR TABS   - counseled to take time for herself to relax and care for her own health.    739.1H Somatic  Dysfunction of Cervical Region  Plan: OSTEOPATHIC MANIPULATIVE TX 3-4 BDY REGIONS  Comment: treatment with good relief        - counterstrain done at C3 and C5   - soft tissue   - articulatory technique    739.2H Somatic Dysfunction of Thoracic Region  Comment:treatment with good relief  Plan: OSTEOPATHIC MANIPULATIVE TX 3-4 BDY REGIONS        - soft Tissue, HVLA at T4, Myofascial    739.47F Somatic Dysfunction of Upper Extremities  Comment: treatment with good relief  Plan: OSTEOPATHIC MANIPULATIVE TX 3-4 BDY REGIONS        - Spencer's bilaterally, soft tissue, myofacial    739.39F Head Region Somatic Dysfunction  Comment: treatment with good relief  Plan: OSTEOPATHIC MANIPULATIVE TX 3-4 BDY REGIONS        - suboccipital release    * Patient given AVS  * Discussed visit with patient and patient understands the course of the visit and all questions were answered.  * Patient to return to clinic 2wk for:      a) f/u depression and will need OMT every 2wk or so as she is very restricted  *  Patient discussed with Dr. Luther Bradley

## 2009-02-13 NOTE — Progress Notes (Signed)
.  .  .        I discussed patient's care with Dr.Gupta. I agree with findings and plan of care as documented in Dr Gupta's* note

## 2009-03-07 ENCOUNTER — Ambulatory Visit (HOSPITAL_BASED_OUTPATIENT_CLINIC_OR_DEPARTMENT_OTHER): Payer: HMO | Admitting: Family Medicine

## 2010-01-01 ENCOUNTER — Encounter (HOSPITAL_BASED_OUTPATIENT_CLINIC_OR_DEPARTMENT_OTHER): Payer: Self-pay | Admitting: Family Medicine

## 2010-01-01 ENCOUNTER — Ambulatory Visit (HOSPITAL_BASED_OUTPATIENT_CLINIC_OR_DEPARTMENT_OTHER): Payer: HMO | Admitting: Family Medicine

## 2010-01-01 VITALS — BP 140/60 | HR 86 | Resp 15 | Ht 61.0 in | Wt 248.0 lb

## 2010-01-01 DIAGNOSIS — F329 Major depressive disorder, single episode, unspecified: Secondary | ICD-10-CM

## 2010-01-01 DIAGNOSIS — R42 Dizziness and giddiness: Principal | ICD-10-CM

## 2010-01-01 DIAGNOSIS — F32A Depression, unspecified: Secondary | ICD-10-CM

## 2010-01-01 LAB — CBC, PLATELET & DIFFERENTIAL
BASOPHIL %: 0.8 % (ref 0.0–2.0)
EOSINOPHIL %: 1.3 % (ref 0.0–7.0)
HEMATOCRIT: 38.7 % (ref 36.0–48.0)
HEMOGLOBIN: 13.2 g/dl (ref 12.0–16.0)
LYMPHOCYTE %: 30.9 % (ref 13.0–39.0)
MEAN CORP HGB CONC: 34.1 g/dl (ref 32.0–36.0)
MEAN CORPUSCULAR HGB: 30.5 pg (ref 27.0–33.0)
MEAN CORPUSCULAR VOL: 89.6 fl (ref 80.0–100.0)
MEAN PLATELET VOLUME: 11.2 fl — ABNORMAL HIGH (ref 6.4–10.8)
MONOCYTE %: 8.9 % (ref 1.0–12.0)
NEUTROPHIL %: 58.1 % (ref 46.0–79.0)
PLATELET COUNT: 241 10*3/uL (ref 150–400)
RBC DISTRIBUTION WIDTH: 12.9 % (ref 11.5–14.3)
RED BLOOD CELL COUNT: 4.32 M/uL — ABNORMAL LOW (ref 4.50–5.10)
WHITE BLOOD CELL COUNT: 9.1 10*3/uL (ref 4.0–10.8)

## 2010-01-01 MED ORDER — FLUOXETINE HCL 20 MG PO CAPS
20.0000 mg | ORAL_CAPSULE | Freq: Every day | ORAL | Status: DC
Start: 2010-01-01 — End: 2010-09-01

## 2010-01-01 NOTE — Patient Instructions (Addendum)
Ibuprofen -take 3 3 times a day for one week, then go to 2 3 times a day for one week then as needed    Start Fluoxetine tomorrow.     Stay hydrated    Protein with every snack and meal    On non-work days get at least one hour of exercise

## 2010-01-01 NOTE — Progress Notes (Signed)
44 year old F here with multiple issues  1) Migraines 2-3 times a week - used to be every 6-8 months, didn't even need meds. Had episode in Jan, fell when had migraine, hit head on side of car. Was seen at [facility]- CT normal. Got Zofran for nausea. Took 3 days for HA to resolve. Unsure why more frequent now- cannot identify any triggers other than stress.       2) Falling a lot, dropping things. Doesn't get dizzy, falls forward. Can often catch herself.   Dropping things with R, maybe both hands. No numbness or tingling, shooting pains.  In past got "loopy" on dexadrine and other meds for DD, depression. Had inconclusive testing for MS- had MRI at [facility] and said they were not able to give her a dx.     Last year had episode where her sugar was low. This feels different to her and is more constant. She is eating regularly and has had some episodes within 30 minutes of eating. Gained back weight she had lost. Does not think odd sensations rae related to food or migraines.     3) Muscles from buttocks  to legs feel quivery. Legs hurt a lot,esp with activity. Unsure if related to falling. Feels unstable at time.   Yesterday felt quivery when moving boxes. Had eaten 1/2 hr before. Off all meds at this time- but has been for last 6 months since had hypoglycemic episode and was told it might be due to combination of omeprazole and fluoxetine.     4) Depressed.  - Finds job very stressful. Works 2 16 hour shifts a week because she lives in [place] and works in [employer]. 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 Daughter present and observes pt is much more irritable off fluoxetine but has not noted these balance issues before.     No hx, sig. Head/brain injury.   PMH, PSH, meds, allergies, FH, Soc hx reviewed and chart updated.   Money stress at home. Middle daughter who is bipolar is off meds, has left with grandson (9 mo). Hates her job.     ROS- no skin changes, CP, SOB,  N/v/d/c, fevers,  chills. Not sleeping well except when exhausted after 16 hour work day.     On exam, tired-appearing F in NAD, A+Ox3, affect appropriate. R handed.   BP 140/60  Pulse 86  Resp 15  Ht 5\' 1"  (1.549 m)  Wt 248 lb (112.492 kg)  SpO2 96%  CN 2-12 tested and intact and symmetrical. No facial droop.   No nystagmus although pt notes dizziness when changing head position in vertical plane.   Strength 5/5 bilat UE, symmetrical except very slightly decreased L hand grip strength- pt unsure if this is new or just because it is her non-dominant hand.   Sensation intact and symmetric throughout.   DTR's 2+/4 UE and LE bilat.    Babinskis- L equivocal, R toe downgoing.   Pt with significant difficulty with L finger tapping and + dysdiadochokinesis on L .   Able to identify objects with both hands.    Neg Rhomberg, + L pronator drift. Neg dysmetria.  Unable to tandem walk without falling. Able to toe and heel walk except for minor balance issues- strength for these seemed intact.     A/P:  1) Dizziness and possible L sided deficits, with > of MS on previous MRI. Will get records and MRI if possible.  Will refer to neuro re: next steps in imagine. Will check some baseline labs today.   Pt to make sure she stays hydrated, eats regularly and has protein at each meal/snack.    2) Depression- after discussion pt did elect to restart fluoxetine. Will restart at 20 mg, titrate up as needed. Will follow PHQ-9 score, sx, feedback from family.        45 min spent with pt with more than half in face to face counseling about above.    F/u in 3-4 weeks.

## 2010-01-02 ENCOUNTER — Telehealth (HOSPITAL_BASED_OUTPATIENT_CLINIC_OR_DEPARTMENT_OTHER): Payer: Self-pay | Admitting: Registered Nurse

## 2010-01-02 ENCOUNTER — Other Ambulatory Visit (HOSPITAL_BASED_OUTPATIENT_CLINIC_OR_DEPARTMENT_OTHER): Payer: Self-pay | Admitting: Family Medicine

## 2010-01-02 ENCOUNTER — Encounter (HOSPITAL_BASED_OUTPATIENT_CLINIC_OR_DEPARTMENT_OTHER): Payer: Self-pay | Admitting: Family Medicine

## 2010-01-02 DIAGNOSIS — R42 Dizziness and giddiness: Principal | ICD-10-CM

## 2010-01-02 NOTE — Progress Notes (Signed)
This encounter was opened in error.  Please disregard.

## 2010-01-02 NOTE — Telephone Encounter (Signed)
MArch 31st 1pm  [facility]    Attempted to reach patient - number not accepting calls

## 2010-01-02 NOTE — Progress Notes (Addendum)
Addended by: Gigi Gin on: 01/02/2010      Modules accepted: Orders

## 2010-01-02 NOTE — Telephone Encounter (Signed)
Message copied by Marlowe Kays on Thu Jan 02, 2010  2:13 PM  ------       Message from: Gigi Gin       Created: Thu Jan 02, 2010  2:03 PM         Who do I call to see if she can be seen by neurology sooner than 6/15?        Thanks,        Sue Lush

## 2010-01-03 ENCOUNTER — Ambulatory Visit: Payer: Self-pay | Admitting: Family Medicine

## 2010-01-03 ENCOUNTER — Telehealth (HOSPITAL_BASED_OUTPATIENT_CLINIC_OR_DEPARTMENT_OTHER): Payer: Self-pay | Admitting: Ambulatory Care

## 2010-01-03 DIAGNOSIS — N63 Unspecified lump in unspecified breast: Principal | ICD-10-CM

## 2010-01-03 LAB — US BREAST-AXILLA RIGHT

## 2010-01-03 LAB — MA DIAGNOSTIC MAMMO BILATERAL WITH CAD

## 2010-01-03 NOTE — Telephone Encounter (Signed)
Message copied by Vinetta Bergamo on Fri Jan 03, 2010 10:32 AM  ------       Message from: RIVERA, [place]       Created: Fri Jan 03, 2010  9:50 AM       Regarding: order         Erica Arroyo [id number], 44 year old, female, Telephone Information:       Home Phone      [phone]       Work Phone      Not on file.       Mobile          [phone]                     CALL BACK NUMBER: [id number]       Cell phone:        Other phone:              Available times:              Patient's language of care: English              Patient does not need an interpreter.              Patient's Care Team:               Person calling on behalf of patient: Britta Mccreedy from the breast center              Calls today they need an order for a bi-lateral diagnostic right breast for a lump. F# [phone]. Pt is being seen today.              Patient's Preferred Pharmacy: [facility]              Phone: [phone] Fax: [phone]

## 2010-01-03 NOTE — Telephone Encounter (Signed)
Per Britta Mccreedy, patient already gone and test completed but  they need the mammogram and ultrasound order.    She needs it to state bilateral diagnostic mammogram and ultrasound for a right breast lump.

## 2010-01-06 LAB — COMPREHENSIVE METABOLIC PANEL
ALANINE AMINOTRANSFERASE: 23 IU/L (ref 7–35)
ALBUMIN: 4.1 g/dl (ref 3.4–4.8)
ALKALINE PHOSPHATASE: 68 IU/L (ref 25–106)
ANION GAP: 6 mmol/L (ref 2–25)
ASPARTATE AMINOTRANSFERASE: 17 IU/L (ref 8–34)
BILIRUBIN TOTAL: 0.6 mg/dl (ref 0.2–1.1)
BUN (UREA NITROGEN): 16 mg/dl (ref 6–20)
CALCIUM: 9.5 mg/dl (ref 8.6–10.3)
CARBON DIOXIDE: 28 mmol/L (ref 22–32)
CHLORIDE: 104 mmol/L (ref 101–111)
CREATININE: 0.7 mg/dl (ref 0.4–1.2)
ESTIMATED GLOMERULAR FILT RATE: >60 mL/min (ref >60)
Glucose Random: 78 mg/dl (ref 74–160)
POTASSIUM: 4.4 mmol/L (ref 3.5–5.1)
SODIUM: 138 mmol/L (ref 135–144)
TOTAL PROTEIN: 7.1 g/dl (ref 5.9–7.5)

## 2010-01-06 LAB — VITAMIN D,25 HYDROXY: VITAMIN D,25 HYDROXY: 13.9 ng/ml — CL (ref 30.0–100.0)

## 2010-01-06 LAB — THYROID SCREEN TSH REFLEX FT4: THYROID SCREEN TSH REFLEX FT4: 2.39 u[IU]/mL (ref 0.34–5.60)

## 2010-01-06 LAB — CHG ASSAY OF FOLIC ACID SERUM: FOLATE: 8.7 ng/mL (ref 3.0–?)

## 2010-01-06 LAB — VITAMIN B12: VITAMIN B12: 216 pg/mL (ref 180–914)

## 2010-01-06 NOTE — Telephone Encounter (Signed)
left voicemail for patient to return our call

## 2010-01-06 NOTE — Telephone Encounter (Signed)
Please let pt know all labs are normal.   Thanks,  AG

## 2010-01-08 ENCOUNTER — Other Ambulatory Visit (HOSPITAL_BASED_OUTPATIENT_CLINIC_OR_DEPARTMENT_OTHER): Payer: Self-pay | Admitting: Family Medicine

## 2010-01-08 DIAGNOSIS — E559 Vitamin D deficiency, unspecified: Secondary | ICD-10-CM

## 2010-01-08 DIAGNOSIS — F418 Other specified anxiety disorders: Principal | ICD-10-CM

## 2010-01-08 MED ORDER — VITAMIN D (ERGOCALCIFEROL) 1.25 MG (50000 UT) PO CAPS
1.00 | ORAL_CAPSULE | ORAL | Status: AC
Start: 2010-01-08 — End: 2010-03-27

## 2010-01-08 MED ORDER — LORAZEPAM 1 MG PO TABS
0.5000 mg | ORAL_TABLET | Freq: Every day | ORAL | Status: AC | PRN
Start: 2010-01-08 — End: 2010-04-10

## 2010-01-08 NOTE — Telephone Encounter (Signed)
Reviewed labs. Will escribe Vit D 50,000 u weekly. She is to take for 12 weeks then we can recheck level, decide on maintenance dose.  Pt also has dentist appt and very anxious. Asking for refill on Ativan for this.   Will give 10 pills total.   Pt aware she needs to pick up prescription here.   AG

## 2010-01-16 ENCOUNTER — Ambulatory Visit (HOSPITAL_BASED_OUTPATIENT_CLINIC_OR_DEPARTMENT_OTHER): Payer: HMO | Admitting: Internal Medicine

## 2010-01-16 ENCOUNTER — Ambulatory Visit (HOSPITAL_BASED_OUTPATIENT_CLINIC_OR_DEPARTMENT_OTHER): Payer: HMO | Admitting: Neurology

## 2010-01-16 ENCOUNTER — Telehealth (HOSPITAL_BASED_OUTPATIENT_CLINIC_OR_DEPARTMENT_OTHER): Payer: Self-pay | Admitting: Registered Nurse

## 2010-01-16 ENCOUNTER — Encounter (HOSPITAL_BASED_OUTPATIENT_CLINIC_OR_DEPARTMENT_OTHER): Payer: Self-pay | Admitting: Neurology

## 2010-01-16 VITALS — BP 120/84 | HR 78 | Wt 244.0 lb

## 2010-01-16 DIAGNOSIS — R32 Unspecified urinary incontinence: Secondary | ICD-10-CM

## 2010-01-16 DIAGNOSIS — M542 Cervicalgia: Secondary | ICD-10-CM

## 2010-01-16 DIAGNOSIS — G43709 Chronic migraine without aura, not intractable, without status migrainosus: Secondary | ICD-10-CM

## 2010-01-16 DIAGNOSIS — R42 Dizziness and giddiness: Secondary | ICD-10-CM

## 2010-01-16 DIAGNOSIS — R253 Fasciculation: Secondary | ICD-10-CM

## 2010-01-16 DIAGNOSIS — G4720 Circadian rhythm sleep disorder, unspecified type: Secondary | ICD-10-CM

## 2010-01-16 MED ORDER — PROMETHAZINE HCL 12.5 MG PO TABS
12.50 mg | ORAL_TABLET | ORAL | Status: AC
Start: 2010-01-16 — End: 2010-02-15

## 2010-01-16 MED ORDER — RIZATRIPTAN BENZOATE 10 MG PO TABS
10.0000 mg | ORAL_TABLET | ORAL | Status: DC
Start: 2010-01-16 — End: 2010-01-16

## 2010-01-16 MED ORDER — ZOLMITRIPTAN 5 MG PO TABS
5.0000 mg | ORAL_TABLET | ORAL | Status: DC
Start: 2010-01-16 — End: 2010-12-15

## 2010-01-16 MED ORDER — NAPROXEN SODIUM 550 MG PO TABS
550.0000 mg | ORAL_TABLET | ORAL | Status: DC
Start: 2010-01-16 — End: 2010-12-15

## 2010-01-16 MED ORDER — TOPIRAMATE 15 MG PO CPSP
15.0000 mg | ORAL_CAPSULE | ORAL | Status: DC
Start: 2010-01-16 — End: 2010-09-01

## 2010-01-16 NOTE — Telephone Encounter (Signed)
Message copied by Remus Loffler on Thu Jan 16, 2010  2:49 PM  ------       Message from: Dortha Kern       Created: Thu Jan 16, 2010  2:14 PM       Regarding: Dr. Lysle Morales - re (2) Rxs, verify dose, substitute       Contact: [phone]         [facility]              Erica Arroyo [id number], 44 year old, female, Telephone Information:       Home Phone      [phone]       Work Phone      Not on file.       Mobile          Not on file.                     Cleotis Lema NUMBER: [phone]       Best time to call back:        Cell phone:        Other phone:              Available times:              Patient's language of care: English              Patient does not need an interpreter.              Patient's PCP: Emiliano Dyer              Person calling on behalf of patient: Christina @ [facility]              Calls today re (2) Rxs:       -Maxalt is too expensive, substitute?       -Topomax, verify dose?              Patient's Preferred Pharmacy:        [facility]              Phone: [phone] Fax: [phone]

## 2010-01-16 NOTE — Telephone Encounter (Signed)
Message copied by Livingston Diones on Thu Jan 16, 2010  3:14 PM  ------       Message from: Remus Loffler       Created: Thu Jan 16, 2010  2:51 PM       Regarding: FW: Dr. Lysle Morales - re (2) Rxs, verify dose, substitute       Contact: [phone]                       ----- Message -----          From: Dortha Kern          Sent: 01/16/2010   2:14 PM            To: Cms Nurses Pool       Subject: Dr. Lysle Morales - re (2) Rxs, verify dose, substi#              [facility]              Erica Arroyo [id number], 44 year old, female, Telephone Information:       Home Phone      [phone]       Work Phone      Not on file.       Mobile          Not on file.                     Cleotis Lema NUMBER: [phone]       Best time to call back:        Cell phone:        Other phone:              Available times:              Patient's language of care: English              Patient does not need an interpreter.              Patient's PCP: Emiliano Dyer              Person calling on behalf of patient: Christina @ [facility]              Calls today re (2) Rxs:       -Maxalt is too expensive, substitute?       -Topomax, verify dose?              Patient's Preferred Pharmacy:        [facility]              Phone: [phone] Fax: [phone]

## 2010-01-16 NOTE — Progress Notes (Signed)
Dear Dr. Roger Shelter,  I had the pleasure of seeing your patient Erica Arroyo based on your request for consultation.  As you know, she is a 44 year old right handed lady that presents complaining of headaches and dizziness.  She started having headaches around the age of 40 after the birth of her daughter.  Prior to that time, she denies any significant headaches.   She denies any other trauma or illness that may have triggered her headaches.  Over the years her headaches have been increasing in frequency and duration, and have been noticeably worse since December 2010.       Currently, her headaches occur 3-4 times a week, and can last 1-4 days.  She estimates having about 15 headache days per month.   The headaches are located on the top of her head and generalize to involve the entire head when severe.  Her headaches have an aching quality, and when severe take on a throbbing quality with an intensity of 10/10.  The pain scale was described to the patient as and 10/10 indicated severe pain with near total disability.        Her headaches involve photophobia, phonophobia, osmophobia, nausea, vomiting, and cutaneous allodynia.  After vomiting, she typically experiences a worsening of her head pain.  Position changes, physical activity, coughing, and sneezing tend to worsen a headache, but never trigger one.  Retreating to a dark, quiet room to lie down tends to improve the headaches.  She denies any visual aura, but admits to visual floaters that are present in the absence of a headache.  With headaches, she often has blurry vision, but denies any transient visual obscurations.  She denies any sensory, motor, or language aura.      She also complains of dizziness which had a previous association with episodic hypotension. 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 The dizziness is described as a sensation of lightheadedness and room spinning.  She notes that it can be related to orthostatic position changes,  especially bending over. She denies neck or eye movements as triggers.  These symptoms have persisted after trying exercises suggested for BPPV. She denies any ringing in her ears, ear, or hearing loss.        In addition, she complains of episodic neck and shoulder pain.  This issue began around the Summer of 2010 when she moved from [place] to [place].  At the time of the injury she was doing some heavy lifting.  She also notes a rotator cuff injury approximately 20 years ago with no significant lasting complaints.         She also mentions occasional twitching in her buttock muscles and bilateral legs.  These twitches can occur at any time, but tend to be associated with physical activity.  The twitching lasts seconds, and is not painful.  She denies any associated weakness or numbness.  She cannot describe any other inciting or alleviating factors.    Regarding sleep, she sleeps 8-10 hours a night when she is not working. She works the night shift on the weekend.  She wakes up feeling poorly rested, and has some daytime drowsiness.  She admits to snoring and is restless at night.  She wakes up at night at times for no reason, but denies any SOB.  She has never had a sleep study.      In terms of headache abortive medications, she has taken ibuprofen, Excedrin, fiorinal, naproxen, Imitrex, Midrin, DHE, compazine, Reglan, Vicodin, and Percocet.  She found Imitrex ineffective, and it somewhat worsened her headaches.  Currently she is using Excedrin Migraine 6-8 tabs per dose, and she estimates using 120 pills in 2 weeks.     She has never been on any headache preventative medications.      She has a family history of headaches including her father, sister, and youngest daughter.      REVIEW OF SYSTEMS AS OF TODAY:      Constitutional: No fever or chills. She has gained approximately 25 pounds recently over 9 months related to decreased activity.     Eyes: No diplopia.     ENT: No hearing loss, no tinnitus,  no ear pain or discharge.    Neck: No swelling or swollen lymph nodes.       Respiratory: No cough, sputum, no dyspnea.     Cardiovascular: No chest pain.      Gastrointestinal: No diarrhea  or constipation.      Genitourinary: Urinary incontinence, which has been progressive over the past 6 months.  Notes that she often does not have the sensation that she needs to urinate and on urination produces large volumes of urine.    Skin: No rashes, hives or worrisome skin lesions.     Neurological: As above.    All other systems reviewed and were negative for any further symptoms.    IMAGING STUDIES  Head CT at [facility] which was an unremarkable study according to the patient.  .  Brain MRI in 2003 at [facility] which was an unremarkable study according to the patient.    EMG of the lower extremities  at [facility] was an inconclusive study according to the patient.        PHYSICAL EXAM  Cardiovascular: + S1S2, RRR, No carotid bruits  Respiratory: Bilaterally clear to auscultation  Abdomen: Soft, Obese, +Bowel Sounds    Neurologic Exam  Mental Status: Normal with regard to orientation, recent and remote memory, attention, language and fund of information.   Cranial: No palpation tenderness of head includingtemporal artery regional tenderness.  Negative Tinels sign over occiput.  No popping or clicking of TMJ on palpation.   Neck: Full ROM in all movements without pain. No occipitonuchal or other tenderness.   Cranial Nerves: Visual Acuity intact to finger counting, Visual fields intact to confrontation, Pupils round and reactive with R 4mm and L 3mm, EOM intact, Facial movements intact and symmetrical, Tongue midline with full movement, Hearing intact bilateral, Facial sensation intact, Head turning/shoulder shrug intact.    Fundoscopy: Venous pulsations and sharp discs noted OU.   Motor: 5/5 strength bilateral  Sensory: Intact to all modalities  Reflex: 2+ Symmetric with absent  Babinski and no clonus.    Coordination: Finger nose, rapid alternating movements intact  Drift: No evidence of drift.    Gait: Unable to perform Tandem gait due to poor balance.  Mildly positive Romberg      IMPRESSION/PLAN  1. Chronic Migraine without Aura  Ms. Scull's clinical history and neurological examination today suggest that she suffers from Chronic Migraine without Aura that has been compounded by medication overuse.  If she continues to take Excedrin, her headaches will likely continue to increase in frequency and intensity.  In addition to worsening the headaches, this medication is likely also contributing to her poor sleep.  As such I would recommend she discontinue taking Excedrin.  Instead for abortive therapy of her moderate headaches I recommend naproxen sodium 550mg  up  to twice a day as needed for headaches, but for no more than 15 days a month.  For her more severe headaches, I recommend Maxalt up to twice a day as needed for headaches, but for no more than 9 days a month.   These medications can be paired up with Phenergan as needed for nausea and headache.  While on naproxen, she should discontinue taking other NSAIDs.  I stressed that abortive medications tend to be more effective when taken during the early phase of a headache, and delaying administration could lead to the ineffectiveness of the abortive treatment.  Due to the frequency and intensity of her headaches, I would recommend Topamax for prophylaxis.  I would start Topamax at 15mg  at night for 1 week, and then increase the daily dose by 15mg  each week to a goal dose of 90mg  at night.  Given her history of episodic hypotension, I would avoid beta blockers and calcium channel blockers.  I advised her that while she is on Excedrin, abortive therapies and preventative therapies like Topamax may not reach peak efficacy.  The patient was given headache diaries, and was requested to bring them with her to her follow-up appointment in  2 months.      2. Sleep Dysfunction  As addressed in problem #1.  Her body habits and sleep history is suggestive of sleep apnea.  I would recommend a sleep study for further evaluation.      3. Vertigo  This issue may be a migrainous phenomenon, and treatment of her migraines may improve this issue.      4. Neck Pain  The patient complained of episodic neck pain. A physical therapy referral would be reasonable for further evaluation and management of this issue. I stressed to the patient that continuing a home stretching and strengthening program afterthe physicaltherapy sessions are completed is essential in maintaininggood neck health.     5. Muscle Twitching  This phenomenon is likely benign considering the short duration of episodes, as well as the lack of pain, weakness, or numbness.  I advised her that dehydration and lack of physical activity can at times contribute to such symptoms.  As such, I encouraged her to pursue a more active lifestyle, remain well hydrated, eat a balanced diet, and consider a multivitamin supplement.  A repeat EMG could be considered if this issue worsens.      6. Urinary Incontinence  A urology referral would be reasonable for further evaluation of this issue.        Assisted by Marvell Fuller, MD    The patient was seen for a total of 80 minutes, and 45 minutes were spent on counseling.  We discussed the pathophysiology of migraine without aura, medication overuse headache, sleep apnea, and vertigo.  We also discussed the pharmacology of Topamax, naproxen, Maxalt, and promethazine.        The patient was ready to learn and no apparent learning barriers were identified.  I explained the diagnosis and treatment plan, and the patient expressed understanding of the content.   The patient was given written instructions for the dosing and titration, and the common side effects of the prescribed medications including the risk of kidney stones. This note was generated and  reviewed with the patient during the appointment.  The patient had no additional information regarding the content of the note.  I attempted to answer any questions regarding the diagnosis and the proposed treatment.

## 2010-01-16 NOTE — Telephone Encounter (Signed)
I had a lengthy conversation with [employer].  The patient has already failed Imitrex, and Maxalt was not on formulary.  According to the pharmacy, the second tier triptan is Zomig, and a prescription was issued over the phone.  I advised the pharmacy to call me back with any further questions or concerns.

## 2010-01-17 DIAGNOSIS — M542 Cervicalgia: Secondary | ICD-10-CM | POA: Insufficient documentation

## 2010-01-17 DIAGNOSIS — G4720 Circadian rhythm sleep disorder, unspecified type: Secondary | ICD-10-CM | POA: Insufficient documentation

## 2010-01-17 DIAGNOSIS — R42 Dizziness and giddiness: Secondary | ICD-10-CM | POA: Insufficient documentation

## 2010-01-29 ENCOUNTER — Ambulatory Visit (HOSPITAL_BASED_OUTPATIENT_CLINIC_OR_DEPARTMENT_OTHER): Payer: HMO | Admitting: Family Medicine

## 2010-02-14 ENCOUNTER — Ambulatory Visit (HOSPITAL_BASED_OUTPATIENT_CLINIC_OR_DEPARTMENT_OTHER): Payer: HMO | Admitting: Internal Medicine

## 2010-03-06 ENCOUNTER — Ambulatory Visit (HOSPITAL_BASED_OUTPATIENT_CLINIC_OR_DEPARTMENT_OTHER): Payer: HMO | Admitting: Neurology

## 2010-04-02 ENCOUNTER — Ambulatory Visit (HOSPITAL_BASED_OUTPATIENT_CLINIC_OR_DEPARTMENT_OTHER): Payer: Self-pay | Admitting: Neurology

## 2010-04-28 ENCOUNTER — Encounter (HOSPITAL_BASED_OUTPATIENT_CLINIC_OR_DEPARTMENT_OTHER): Payer: Self-pay

## 2010-04-28 ENCOUNTER — Ambulatory Visit (HOSPITAL_BASED_OUTPATIENT_CLINIC_OR_DEPARTMENT_OTHER): Payer: HMO | Admitting: Family Medicine

## 2010-04-28 ENCOUNTER — Emergency Department (HOSPITAL_BASED_OUTPATIENT_CLINIC_OR_DEPARTMENT_OTHER)
Admission: RE | Admit: 2010-04-28 | Disposition: A | Discharge status: Home / Self Care | Payer: Self-pay | Source: Emergency Department | Attending: Emergency Medicine | Admitting: Emergency Medicine

## 2010-04-28 MED ORDER — AZITHROMYCIN 250 MG PO TABS
250.00 mg | ORAL_TABLET | ORAL | Status: AC
Start: 2010-04-28 — End: 2010-05-03

## 2010-04-28 MED ORDER — IBUPROFEN 600 MG PO TABS
600.0000 mg | ORAL_TABLET | Freq: Three times a day (TID) | ORAL | Status: DC | PRN
Start: 2010-04-28 — End: 2010-12-15

## 2010-04-28 MED ORDER — IBUPROFEN 800 MG PO TABS
ORAL_TABLET | ORAL | Status: AC
Start: 2010-04-28 — End: 2010-04-28
  Filled 2010-04-28: qty 1

## 2010-04-28 MED ORDER — BENZONATATE 100 MG PO CAPS
100.00 mg | ORAL_CAPSULE | Freq: Three times a day (TID) | ORAL | Status: AC | PRN
Start: 2010-04-28 — End: 2010-05-05

## 2010-05-17 LAB — EMERGENCY ROOM NOTE

## 2010-07-15 ENCOUNTER — Encounter (HOSPITAL_BASED_OUTPATIENT_CLINIC_OR_DEPARTMENT_OTHER): Payer: HMO | Admitting: Family Medicine

## 2010-09-01 ENCOUNTER — Ambulatory Visit (HOSPITAL_BASED_OUTPATIENT_CLINIC_OR_DEPARTMENT_OTHER): Payer: HMO

## 2010-09-01 ENCOUNTER — Encounter (HOSPITAL_BASED_OUTPATIENT_CLINIC_OR_DEPARTMENT_OTHER): Payer: Self-pay

## 2010-09-01 ENCOUNTER — Encounter (HOSPITAL_BASED_OUTPATIENT_CLINIC_OR_DEPARTMENT_OTHER): Payer: HMO | Admitting: Family Medicine

## 2010-09-01 VITALS — BP 110/60 | HR 58 | Temp 98.9°F | Ht 62.0 in | Wt 247.0 lb

## 2010-09-01 DIAGNOSIS — G43109 Migraine with aura, not intractable, without status migrainosus: Secondary | ICD-10-CM

## 2010-09-01 DIAGNOSIS — Z23 Encounter for immunization: Secondary | ICD-10-CM

## 2010-09-01 DIAGNOSIS — F329 Major depressive disorder, single episode, unspecified: Secondary | ICD-10-CM

## 2010-09-01 DIAGNOSIS — E559 Vitamin D deficiency, unspecified: Secondary | ICD-10-CM

## 2010-09-01 DIAGNOSIS — F32A Depression, unspecified: Secondary | ICD-10-CM

## 2010-09-01 DIAGNOSIS — Z0000 Encounter for general adult medical examination without abnormal findings: Principal | ICD-10-CM

## 2010-09-01 DIAGNOSIS — E8940 Asymptomatic postprocedural ovarian failure: Secondary | ICD-10-CM

## 2010-09-01 DIAGNOSIS — E785 Hyperlipidemia, unspecified: Secondary | ICD-10-CM

## 2010-09-01 MED ORDER — FLUOXETINE HCL 20 MG PO CAPS
20.0000 mg | ORAL_CAPSULE | Freq: Every day | ORAL | Status: DC
Start: 2010-09-01 — End: 2012-09-22

## 2010-09-01 MED ORDER — TOPIRAMATE 15 MG PO CPSP
15.0000 mg | ORAL_CAPSULE | ORAL | Status: DC
Start: 2010-09-01 — End: 2010-12-15

## 2010-09-01 NOTE — Progress Notes (Signed)
Influenza Vaccine Procedure  September 01, 2010    1. Has the patient received the information for the influenza vaccine? Yes    2. Does the patient have any of the following contraindications?  Allergy to eggs? No  Allergic reaction to previous influenza vaccines? No  Any other problems to previous influenza vaccines? No  Paralyzed by Guillain-Barre syndrome?  No  Current moderate or severe illness? No  Allergy to contact lens solution? No    3. The vaccine has been administered in the usual fashion.     Immunization information reviewed. Current VIS reviewed and given to patient/ guardian. Verbal assent obtained from patient/ guardian.  See immunization/Injection module or chart review for date of publication and additional information. Verbal assent obtained from patient/guardian. Comfort measures for possible side effects reviewed.

## 2010-09-03 LAB — CHOLESTEROL: Cholesterol: 188 mg/dl (ref 0–200)

## 2010-09-03 LAB — CHG LIPOPROTEIN DIR MEAS HIGH DENSITY CHOLESTEROL: HIGH DENSITY LIPOPROTEIN: 40 mg/dl (ref 35–85)

## 2010-09-03 LAB — VITAMIN D,25 HYDROXY: VITAMIN D,25 HYDROXY: 33.8 ng/ml (ref 30.0–100.0)

## 2010-09-03 LAB — CHG LIPOPROTEIN DIRECT MEASUREMENT LDL CHOLESTEROL: LOW DENSITY LIPOPROTEIN DIRECT: 126 mg/dl — ABNORMAL HIGH (ref 0–100)

## 2010-09-13 ENCOUNTER — Emergency Department (HOSPITAL_BASED_OUTPATIENT_CLINIC_OR_DEPARTMENT_OTHER)
Admission: RE | Admit: 2010-09-13 | Disposition: A | Discharge status: Home / Self Care | Payer: Self-pay | Source: Emergency Department | Attending: Medical | Admitting: Medical

## 2010-09-13 ENCOUNTER — Encounter (HOSPITAL_BASED_OUTPATIENT_CLINIC_OR_DEPARTMENT_OTHER): Payer: Self-pay

## 2010-09-13 LAB — CBC, PLATELET & DIFFERENTIAL
BASOPHIL %: 0.7 % (ref 0.0–2.0)
EOSINOPHIL %: 2.6 % (ref 0.0–7.0)
HEMATOCRIT: 39.9 % (ref 36.0–48.0)
HEMOGLOBIN: 13.8 g/dl (ref 12.0–16.0)
LYMPHOCYTE %: 35.5 % (ref 13.0–39.0)
MEAN CORP HGB CONC: 34.6 g/dl (ref 32.0–36.0)
MEAN CORPUSCULAR HGB: 30.5 pg (ref 27.0–33.0)
MEAN CORPUSCULAR VOL: 88 fl (ref 80.0–100.0)
MEAN PLATELET VOLUME: 10.8 fl (ref 6.4–10.8)
MONOCYTE %: 7.1 % (ref 1.0–12.0)
NEUTROPHIL %: 54.1 % (ref 46.0–79.0)
PLATELET COUNT: 249 10*3/uL (ref 150–400)
RBC DISTRIBUTION WIDTH: 13.2 % (ref 11.5–14.3)
RED BLOOD CELL COUNT: 4.53 M/uL (ref 4.50–5.10)
WHITE BLOOD CELL COUNT: 9.1 10*3/uL (ref 4.0–10.8)

## 2010-09-13 MED ORDER — TRUVADA PEP KIT
Status: AC
Start: 2010-09-13 — End: 2010-09-13
  Administered 2010-09-13: 1 via ORAL

## 2010-09-13 MED ORDER — TETANUS-DIPHTH-ACELL PERTUSSIS 5-2.5-18.5 LF-MCG/0.5 IM SUSP
0.50 mL | Freq: Once | INTRAMUSCULAR | Status: AC
Start: 2010-09-13 — End: 2010-09-13
  Administered 2010-09-13: 0.5 mL via INTRAMUSCULAR
  Filled 2010-09-13: qty 0.5

## 2010-09-13 MED ORDER — ONDANSETRON 4 MG PO TBDP
4.00 mg | ORAL_TABLET | Freq: Once | ORAL | Status: AC
Start: 2010-09-13 — End: 2010-09-13
  Administered 2010-09-13: 4 mg via ORAL
  Filled 2010-09-13: qty 1

## 2010-09-13 MED ORDER — ONDANSETRON HCL 4 MG PO TABS
4.00 mg | ORAL_TABLET | Freq: Three times a day (TID) | ORAL | Status: AC | PRN
Start: 2010-09-13 — End: 2010-09-20

## 2010-09-13 MED ORDER — HEPATITIS B IMMUNE GLOBULIN IJ SOLN: 0.0600 mL/kg | Freq: Once | INTRAMUSCULAR | Status: DC

## 2010-09-13 NOTE — Discharge Instructions (Signed)
Diagnosis: needlestick    What to do next: Labs were drawn. You were given a tetanus shot. Start truvada once daily. Take zofran every 6 hours as needed for nausea. Occupational health will call to schedule follow up.

## 2010-09-15 ENCOUNTER — Ambulatory Visit (HOSPITAL_BASED_OUTPATIENT_CLINIC_OR_DEPARTMENT_OTHER): Payer: Self-pay | Admitting: Internal Medicine

## 2010-09-16 LAB — COMPREHENSIVE METABOLIC PANEL
ALANINE AMINOTRANSFERASE: 27 IU/L (ref 7–35)
ALBUMIN: 4.3 g/dl (ref 3.4–4.8)
ALKALINE PHOSPHATASE: 68 IU/L (ref 25–106)
ANION GAP: 6 mmol/L (ref 2–25)
ASPARTATE AMINOTRANSFERASE: 22 IU/L (ref 8–34)
BILIRUBIN TOTAL: 0.5 mg/dl (ref 0.2–1.1)
BUN (UREA NITROGEN): 14 mg/dl (ref 6–20)
CALCIUM: 9.5 mg/dl (ref 8.6–10.3)
CARBON DIOXIDE: 30 mmol/L (ref 22–32)
CHLORIDE: 103 mmol/L (ref 101–111)
CREATININE: 0.8 mg/dl (ref 0.4–1.2)
ESTIMATED GLOMERULAR FILT RATE: >60 mL/min (ref >60)
Glucose Random: 80 mg/dl (ref 74–160)
POTASSIUM: 3.8 mmol/L (ref 3.5–5.1)
SODIUM: 139 mmol/L (ref 135–144)
TOTAL PROTEIN: 7.4 g/dl (ref 5.9–7.5)

## 2010-09-16 LAB — HEPATITIS B SURFACE ANTIGEN: HEPATITIS B SURFACE ANTIGEN: NONREACTIVE

## 2010-09-16 LAB — HEPATITIS B SURFACE ANTIBODY: HEPATITIS B SURFACE ANTIBODY: REACTIVE

## 2010-09-16 LAB — HEPATITIS C ANTIBODY: HEPATITIS C ANTIBODY: NEGATIVE

## 2010-09-26 ENCOUNTER — Ambulatory Visit (HOSPITAL_BASED_OUTPATIENT_CLINIC_OR_DEPARTMENT_OTHER): Payer: Self-pay | Admitting: Occupational Medicine

## 2010-11-21 LAB — EMERGENCY ROOM NOTE

## 2010-12-15 ENCOUNTER — Encounter (HOSPITAL_BASED_OUTPATIENT_CLINIC_OR_DEPARTMENT_OTHER): Payer: Self-pay | Admitting: Family Medicine

## 2010-12-15 ENCOUNTER — Ambulatory Visit (HOSPITAL_BASED_OUTPATIENT_CLINIC_OR_DEPARTMENT_OTHER): Payer: HMO | Admitting: Family Medicine

## 2010-12-15 VITALS — BP 122/80 | HR 71 | Temp 97.7°F | Resp 18 | Wt 240.0 lb

## 2010-12-15 DIAGNOSIS — J45909 Unspecified asthma, uncomplicated: Secondary | ICD-10-CM

## 2010-12-15 DIAGNOSIS — J069 Acute upper respiratory infection, unspecified: Principal | ICD-10-CM

## 2010-12-15 MED ORDER — GUAIFENESIN-CODEINE 100-10 MG/5ML PO SYRP
5.0000 mL | ORAL_SOLUTION | Freq: Three times a day (TID) | ORAL | Status: AC | PRN
Start: 2010-12-15 — End: 2010-12-29

## 2010-12-15 MED ORDER — ALBUTEROL SULFATE HFA 108 (90 BASE) MCG/ACT IN AERS
2.00 | INHALATION_SPRAY | Freq: Four times a day (QID) | RESPIRATORY_TRACT | Status: AC | PRN
Start: 2010-12-15 — End: 2020-12-15

## 2010-12-31 ENCOUNTER — Telehealth (HOSPITAL_BASED_OUTPATIENT_CLINIC_OR_DEPARTMENT_OTHER): Payer: Self-pay | Admitting: Family Medicine

## 2011-01-15 ENCOUNTER — Telehealth (HOSPITAL_BASED_OUTPATIENT_CLINIC_OR_DEPARTMENT_OTHER): Payer: Self-pay | Admitting: Family Medicine

## 2011-02-16 ENCOUNTER — Encounter (HOSPITAL_BASED_OUTPATIENT_CLINIC_OR_DEPARTMENT_OTHER): Payer: Self-pay | Admitting: Family Medicine

## 2011-02-16 ENCOUNTER — Ambulatory Visit (HOSPITAL_BASED_OUTPATIENT_CLINIC_OR_DEPARTMENT_OTHER): Payer: HMO | Admitting: Family Medicine

## 2011-02-16 VITALS — BP 122/88 | HR 74 | Temp 98.3°F | Resp 16 | Wt 242.0 lb

## 2011-02-16 DIAGNOSIS — N200 Calculus of kidney: Principal | ICD-10-CM

## 2011-02-16 LAB — URINE DIP (POINT OF CARE)
BILIRUBIN, URINE: NEGATIVE
GLUCOSE, URINE: NEGATIVE mg/dl
KETONE, URINE: NEGATIVE mg/dl
NITRITE, URINE: NEGATIVE
OCCULT BLOOD, URINE: NEGATIVE
PH URINE: 5 (ref 5.0–8.0)
PROTEIN, URINE: NEGATIVE mg/dl (ref 0–15)
SPECIFIC GRAVITY URINE: 1.025 (ref 1.003–1.030)
UROBILINOGEN URINE: 0.2 mg/dl (ref 0.2–1.0)

## 2011-02-16 MED ORDER — KETOROLAC TROMETHAMINE 10 MG PO TABS
10.00 mg | ORAL_TABLET | Freq: Four times a day (QID) | ORAL | Status: AC | PRN
Start: 2011-02-16 — End: 2011-02-21

## 2011-02-16 MED ORDER — TAMSULOSIN HCL 0.4 MG PO CAPS
0.40 mg | ORAL_CAPSULE | Freq: Every day | ORAL | Status: AC
Start: 2011-02-16 — End: 2011-03-18

## 2011-03-24 ENCOUNTER — Telehealth (HOSPITAL_BASED_OUTPATIENT_CLINIC_OR_DEPARTMENT_OTHER): Payer: Self-pay | Admitting: Ambulatory Care

## 2011-03-24 NOTE — Telephone Encounter (Signed)
Called and spoke to patient. She lives in [place] and she currently took custody of her 2 grandchildren, one who is a 60 month old who she has had since birth. She states she is currently going thru the foster license process and has a form that she needs Dr.Gorodon to fill out about her. She states she is not currently on any meds.  It is listed in her med history she is on Prozac but she states she never took it and is not taking it.       She would like Dr.Gordon to fill out as soon as possible and fax back, because she was just told it was all supposed to be done by June 1st, but they never told her that until now.  She is frustrated about this and said they just came last week for the home visit.    Gave her the number to fax paperwork and I will put in Dr.Gordon's mailbox when it comes over the fax machine.

## 2011-03-24 NOTE — Telephone Encounter (Signed)
Message copied by Vinetta Bergamo on Tue Mar 24, 2011 10:08 AM  ------       Message from: RIVERA, [place]       Created: Tue Mar 24, 2011  9:41 AM       Regarding: form         Erica Arroyo [id number], 45 year old, female, Telephone Information:       Home Phone      [phone]       Work Phone      Not on file.       Mobile          [phone]                     CALL BACK NUMBER: [phone]       Cell phone:        Other phone:              Available times:              Patient's language of care: English              Patient does not need an interpreter.              Patient's Care Team:               Person calling on behalf of patient: Patient (self)              Calls today with questions and concerns. Re: a form she is faxing over that she needs Dr. Roger Shelter to fill out.              Patient's [facility]: [facility] [place]              Phone: [phone] Fax: [phone]

## 2011-03-25 NOTE — Telephone Encounter (Signed)
Called patient again because I did not see fax. She faxed it from an office and it not sure if it went thru. She is coming down this way this weekend and will drop the form off then.  Advised I would let Dr.Gordon know.

## 2011-03-27 NOTE — Telephone Encounter (Signed)
Form is in Dr Jake Seats mailbox  When completed pls fax back to them and let Toya know when done so she can follow up with them.  She hopes this can be done today

## 2011-03-27 NOTE — Telephone Encounter (Signed)
Done- will scan and fax today. Please let pt know.   Thanks,   Sue Lush

## 2011-04-11 ENCOUNTER — Other Ambulatory Visit (HOSPITAL_BASED_OUTPATIENT_CLINIC_OR_DEPARTMENT_OTHER): Payer: Self-pay | Admitting: Family Medicine

## 2011-04-13 NOTE — Telephone Encounter (Signed)
Refill denies - Flomax was given for presumed renal stone on 4/30, if she's still having symptoms suggestive of renal stones she needs to be seen again (most preferably by PCP) to be re-evaluated as she may need further testing.     Front desk,    CAn you please call pt to get her scheduled for next available with Dr. Roger Shelter?     Tx.  ~g

## 2011-04-13 NOTE — Telephone Encounter (Signed)
Valori Nur is a 45 year old female has requested a refill of Tamsulosin 0.4 mg.  Other Med Adult:  Most Recent BP Reading(s)  02/16/11 : 122/88        Cholesterol (mg/dl)   Date     Date  Value    09/01/2010  188    ----------    LDL (mg/dl)   Date     Date  Value    09/01/2010  126*   ----------    HDL (mg/dl)   Date     Date  Value    09/01/2010  40    ----------    TRIGLYCERIDE (mg/dl)   Date     Date  Value    04/12/2008  91    ----------        THYROID SCREEN TSH (uIU/mL)   Date     Date  Value    01/01/2010  2.39    ----------        THYROID STIM HORMONE (uIU/mL)   Date     Date  Value    01/06/2008  2.18    ----------      HEMOGLOBIN A1C (%)   Date     Date  Value    01/05/2008  5.6    ----------        INR (no units)   Date     Date  Value    05/22/2008  1.0*   ----------       Documented [facility] [place] [phone] Fax: [phone]

## 2012-02-26 ENCOUNTER — Telehealth (HOSPITAL_BASED_OUTPATIENT_CLINIC_OR_DEPARTMENT_OTHER): Payer: Self-pay | Admitting: Family Medicine

## 2012-02-26 NOTE — Telephone Encounter (Signed)
[facility]    Erica Arroyo [id number], 46 year old, female,   Telephone Information:   Home Phone [phone]   Work Phone Not on file.   Mobile [phone]       [facility] [place] [phone] Fax: [phone]    CONFIRMED TODAY: Yes    CALL BACK NUMBER: [phone]  Best time to call back:   Cell phone:   Other phone:    Available times:    Patient's language of care: English    Patient does not need an interpreter.    Patient's PCP: Emiliano Dyer    Person calling on behalf of patient: Patient (self)    Calls today : pt tried to move her apt before May 24. Told her we don't have any opening before that. She won't believe me and stating she wants to speak to the nurse.

## 2012-02-26 NOTE — Telephone Encounter (Signed)
Called patient back - reports having severe anxiety and needing to be seen.  Has appt at [facility] 5/24 but cannot wait until then.  Offered appt tomorrow as below but needs to work.  Discussed possibility of going to ED for urgent anxiety care and patient reports she cannot afford the copay.  Patient reports frustration with not being able to schedule appointments and frustration over all the doctors being part time.      Will forward to PCP to keep in loop.

## 2012-02-26 NOTE — Telephone Encounter (Signed)
Patient calls today very upset she can not book an appt.  She states she has been a patient of Dr. Roger Shelter for a long time.    Patient states she is having terrible anxiety and can not wait until May 24 to be seen;  Advised Dr. Roger Shelter is not available until May 22.  Patient states she will see any doctor.    Offered appt. With Dr. Kathlee Nations tomorrow morning at 11 am (we are booked today with no openings).  Patient asked for earlier appt;  Advised there are none and clinic open only 10-1 tomorrow.    Patient states she is frustrated because she is living in [place] and has trouble getting appointments and would like one of the doctors covering for Dr. Roger Shelter to call her.

## 2012-03-04 ENCOUNTER — Encounter (HOSPITAL_BASED_OUTPATIENT_CLINIC_OR_DEPARTMENT_OTHER): Payer: Self-pay | Admitting: Family Medicine

## 2012-03-04 ENCOUNTER — Ambulatory Visit (HOSPITAL_BASED_OUTPATIENT_CLINIC_OR_DEPARTMENT_OTHER): Payer: Commercial Managed Care - PPO | Admitting: Family Medicine

## 2012-03-04 VITALS — BP 130/90 | HR 62 | Temp 98.0°F | Wt 248.0 lb

## 2012-03-04 DIAGNOSIS — F411 Generalized anxiety disorder: Principal | ICD-10-CM

## 2012-03-04 DIAGNOSIS — G43709 Chronic migraine without aura, not intractable, without status migrainosus: Secondary | ICD-10-CM

## 2012-03-04 DIAGNOSIS — F430 Acute stress reaction: Principal | ICD-10-CM

## 2012-03-04 MED ORDER — AMITRIPTYLINE HCL 25 MG PO TABS
25.0000 mg | ORAL_TABLET | Freq: Every evening | ORAL | Status: DC
Start: 2012-03-04 — End: 2012-09-22

## 2012-03-04 NOTE — Progress Notes (Signed)
I personally discussed and reviewed the history, physical, and plan with the resident.  Please see resident's note for further details.

## 2012-03-04 NOTE — Progress Notes (Signed)
BP 130/90  Pulse 62  Temp(Src) 98 F (36.7 C) (Temporal)  Wt 248 lb (112.492 kg)  BMI 45.35 kg/m2  SpO2 98%    S:    Erica Arroyo is a 46 year old female who presents with the following concerns:    1. Anxiety:   - Wicked anxiety  - Palpitations  - About a month ago  - A lot of different stressors  - Has had problems with depression and anxiety in the past  - Took prozac before for depression - stopped 2 years ago  - Suppoed to be on topamax for migraines  - Daughter is bipolar and has two children and now is adopting them because they have been taking away  - One is a newborn baby  - Trying to do some incense and in home therapy  - Lives up in [place] and commutes here for daycare - very complicated  - Planning to relocate back here     ROS: Per HPI. Denies headaches, weakness/falls, chest pain, shortness of breath, abdominal pain/nausea/vomiting, numbness/tingling, fevers or chills.     Medications, allergies, PMH and SH reviewed and updated in Epic.    O:  PHYSICAL EXAM:  BP 130/90  Pulse 62  Temp(Src) 98 F (36.7 C) (Temporal)  Wt 248 lb (112.492 kg)  BMI 45.35 kg/m2  SpO2 98%  GEN: NAD  NEURO: No focal deficits noted, alert, normal gait observed  EXT: No edema   PSYCH: Appears stated age, appropriate grooming and dress, appropriate affect and mood congruent    A/P:  Erica Arroyo is a 46 year old female presents with:    (308.0) Anxiety as acute reaction to exceptional stress  (primary encounter diagnosis)  PHQ( - 18  GAD7 - 14  - Lots of real stressors in her life  - Referral for therapy here   Plan: amitriptyline (ELAVIL) 25 MG tablet, REFERRAL         TO ADULT PSYCHIATRY ( INT)    (346.70) Chronic migraine without aura  Comment: Was on topimax - worked well but too sleepy  Plan: amitriptyline (ELAVIL) 25 MG tablet        Erica try this as may also double fo anxiety control    Jonne Rote R. Otis Brace, MD

## 2012-03-10 NOTE — Addendum Note (Signed)
Addended by: Mercer Pod on: 03/10/2012 04:49 PM     Modules accepted: Orders

## 2012-03-11 ENCOUNTER — Ambulatory Visit: Payer: Self-pay | Admitting: Family Medicine

## 2012-03-11 ENCOUNTER — Ambulatory Visit (HOSPITAL_BASED_OUTPATIENT_CLINIC_OR_DEPARTMENT_OTHER): Payer: Self-pay | Admitting: Family Medicine

## 2012-03-11 ENCOUNTER — Other Ambulatory Visit (HOSPITAL_BASED_OUTPATIENT_CLINIC_OR_DEPARTMENT_OTHER): Payer: Self-pay | Admitting: Social Worker

## 2012-03-11 NOTE — Telephone Encounter (Signed)
Unfortunately Erica Arroyo is out of network for psychiatry. We will inform your patient that she should call her insurance  company to request resources in her network.

## 2012-03-15 LAB — MA SCREENING MAMMO BILATERAL WITH CAD

## 2012-03-18 ENCOUNTER — Telehealth (HOSPITAL_BASED_OUTPATIENT_CLINIC_OR_DEPARTMENT_OTHER): Payer: Self-pay | Admitting: Family Medicine

## 2012-03-18 NOTE — Progress Notes (Signed)
Erica Arroyo [id number], 46 year old, female,   Telephone Information:   Home Phone [phone]   Work Phone Not on file.   Mobile [phone]       Patient's [facility]erred [facility]:     [id number] [place] [place] - [address] AT [facility]  Phone: [phone] Fax: [phone]      CONFIRMED TODAY: Yes    CALL BACK NUMBER: [phone]  Best time to call back: anytime  Cell phone:   Other phone:    Available times:    Patient's language of care: English    Patient needs a [place] interpreter.    Patient's PCP: Emiliano Dyer    Person calling on behalf of patient: Patient (self)    Calls today with a sick call.  to speak to nurse only.  to speak to provider only. Patient is calling to speak with a RN, she states that she is having bad side effects from a new medication given to her last week, please advise. Thanks

## 2012-03-18 NOTE — Progress Notes (Signed)
Patient states she only took one dose of elavil because it made her extremely lethargic and groggy, and she can not be that way as she has a 93 month old and 47 year old.    Patient is calling to notify Dr.s Roger Shelter and Otis Brace she has started taking Erica Arroyo; her anxiety is much better and she would like feed back from the doctors on this.    Also, patient states the referral to pscyh is out of her network.  At this point she thinks she will just wait about psych, but would like to discuss with either Dr. Roger Shelter or Otis Brace.

## 2012-03-18 NOTE — Progress Notes (Signed)
Discussed homeopathic ignatia - takes 4 pills 3-4 times a day. Feels this is working very well.   Will keep her off amitriptylline as that made her too groggy.   Also using aromatherapy and music to help relax.     Need to find someone closer to her home in [place] for psych - travels down to our area on weekends for work but need someone closer.   Now has 2 grandchildren. 3 and 15 months -older has some behavioral issues. Has a lot of stress. Does have a case manager for her grandsons which is helping.     She will find out who would be covered for psych in her area and call me so that i can put in the referral .   Pt encouraged to take time for herself, even if just a minute here and there.   AG

## 2012-04-14 ENCOUNTER — Encounter (HOSPITAL_BASED_OUTPATIENT_CLINIC_OR_DEPARTMENT_OTHER): Payer: Self-pay | Admitting: Family Medicine

## 2012-04-14 ENCOUNTER — Ambulatory Visit (HOSPITAL_BASED_OUTPATIENT_CLINIC_OR_DEPARTMENT_OTHER): Payer: Commercial Managed Care - PPO | Admitting: Family Medicine

## 2012-04-14 VITALS — BP 100/64 | HR 94 | Temp 96.6°F | Resp 19 | Ht 62.0 in | Wt 251.0 lb

## 2012-04-14 DIAGNOSIS — E66813 Obesity, class 3: Secondary | ICD-10-CM

## 2012-04-14 DIAGNOSIS — R6889 Other general symptoms and signs: Secondary | ICD-10-CM

## 2012-04-14 DIAGNOSIS — Z0000 Encounter for general adult medical examination without abnormal findings: Principal | ICD-10-CM

## 2012-04-14 DIAGNOSIS — F418 Other specified anxiety disorders: Secondary | ICD-10-CM

## 2012-04-14 DIAGNOSIS — R7611 Nonspecific reaction to tuberculin skin test without active tuberculosis: Secondary | ICD-10-CM

## 2012-04-14 NOTE — Progress Notes (Signed)
Discussed pt w/ Dr Otis Brace. Did not examine the patient. 46 yo female here for cpex. Discussion also included asthma, morbidly obese and mental health. Agree with the assessment and plan, as noted by the resident. Marlowe Sax, MD

## 2012-04-14 NOTE — Progress Notes (Signed)
YEARLY PHYSICAL EXAM NOTE:     Erica Arroyo is a 46 year old female who presents for their yearly physical exam with the following additional concerns:     1. Doing better since last visit:   - Tried amytriptiline - made her way too doped up  - Tried a herbal medicine (ignatia amara) which Dr. Roger Shelter had approved  - Byaby trying chamomile pills  - Job is still so stressful  - Looking for a new job  - Moving to [place]    2. Mammo last visit  No pap - No cervix    3. Edema in feet  - Has been keeping sodium intake low    4. Weight:   - Increased walking - couple of miles a day  - Hard time with that physically  - Has some asthma but really only with URI so whuy SOB during walking  - Thinking about the surgery      ROS: + hot and cold intolerance + weight gain  GEN: No fatigue, weight loss, dizziness, syncope  HEENT: No headache, visual changes, eye pain, blurry vision, earaches, hearing loss, eye or ear discharge, runny nose, mouth sores, sore throat.  NECK: No neck pain, limited ROM, lumps or bumps.  CHEST: No chest pain or palpitations.  LUNGS: No SOB or cough.  GI: No reflux, abdominal pain, nausea, vomiting, diarrhea, constipation, or hemorrhoids.  GU: No dysuria, burning, itching, frequency, urgency or discharge.  MUSC: No muscle or joint pain.  DERM: No rash, itch or changing moles  NEURO: No numbness, tingling, weakness or seizures.  PSYCH: No depression or anxiety. Mood stable.    Medications, allergies and PMH reviewed and updated in Epic.    SH:    reports that she has never smoked. She does not have any smokeless tobacco history on file.   reports that  drinks alcohol.    FH:    Family History    Heart Father     Comment: multiple MI first in early 61's, has multiple Stents    Renal Father     Comment: on dialysis    Hypertension Father     GI Father     Comment: diverticulitis    Diabetes Father     No Known Family History Mother     Psychiatric Illness Brother     Comment: bipolar,  schizophrenic    Neurological Sister     Comment: ? early dementia    Mental/Emotional Disorders Daughter     Comment: Bipolar       O:  PHYSICAL EXAM:  BP 100/64  Pulse 94  Temp(Src) 96.6 F (35.9 C) (Temporal)  Resp 19  Ht 5\' 2"  (1.575 m)  Wt 251 lb (113.853 kg)  BMI 45.9 kg/m2  SpO2 97%  GEN: NAD  DERM: WWP, no rashes  Head: NCAT  Eyes: PERRL, EOMI, fundi normal  ENT: MMM, no facial tenderness, throat clear, poor dentition with gap at front, TM's pearly gray, ears nontender to palp. No nasal or ear discharge.  NECK: full range of motion, no thyromegaly, no palpable lymph nodes.  CHEST: clear to auscultation bilaterally, no W/R/R/R.  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.01) Obesity, Class III, BMI 40-49.9 (morbid obesity)  Comment: Main issue - significant morbidity and decrease in functional capacity  Plan: REFERRAL TO GENERAL SURGERY (EXT), COLLECTION         VENOUS BLOOD VENIPUNCTURE, HEMOGLOBIN A1C,         VITAMIN D,25 HYDROXY, COLLECTION VENOUS BLOOD         VENIPUNCTURE, HEMOGLOBIN A1C, THYROID STIM         HORMONE, VITAMIN D,25 HYDROXY        Filled out and faxed referral form to [facility]    (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 Discussed that no need to test without symptoms.  Patient does feel that she is very sensative to heat - much more so than other colleagues - she reports occasional sweats at night. Passed through menopause 20 years prior after surgery  Plan:  THYROID STIM HORMONE  Discussed low concern given no other symptoms but given unusual occasional night sweat type episodes will check TSH and advised patient to cont to monitor symptoms.     Health Maintenance:  Reviewed HM    I have reviewed the past medical, surgical, social and family history and updated these sections of EpicCare as relevant. All interim labs, test results, and consult notes were reviewed and discussed with Erica Arroyo. Medications were reconciled during this visit and a current medication list was given to the patient at the end of the visit.    More than 20 mins of time was spent in counseling.     Doreather Hoxworth R. Otis Brace, MD

## 2012-04-19 ENCOUNTER — Ambulatory Visit (HOSPITAL_BASED_OUTPATIENT_CLINIC_OR_DEPARTMENT_OTHER): Payer: Commercial Managed Care - PPO | Admitting: Family Medicine

## 2012-04-30 ENCOUNTER — Telehealth (HOSPITAL_BASED_OUTPATIENT_CLINIC_OR_DEPARTMENT_OTHER): Payer: Self-pay | Admitting: Family Medicine

## 2012-04-30 NOTE — Progress Notes (Signed)
Re: referral to General Surgery - surgery consult - [facility]    I called and spoke to [facility] specialist. Pt refused the consultation appt with [facility] when they called her on July 2. Can you cancel the order if pt does not needs it.  Thanks.

## 2012-05-02 NOTE — Progress Notes (Signed)
Non-urgent, leave for PCP.

## 2012-05-04 NOTE — Addendum Note (Signed)
Addended by: Hal Morales on: 05/04/2012 09:57 PM     Modules accepted: Orders

## 2012-09-22 ENCOUNTER — Ambulatory Visit (HOSPITAL_BASED_OUTPATIENT_CLINIC_OR_DEPARTMENT_OTHER): Payer: PRIVATE HEALTH INSURANCE | Admitting: Family Medicine

## 2012-09-22 ENCOUNTER — Encounter (HOSPITAL_BASED_OUTPATIENT_CLINIC_OR_DEPARTMENT_OTHER): Payer: Self-pay | Admitting: Family Medicine

## 2012-09-22 ENCOUNTER — Telehealth (HOSPITAL_BASED_OUTPATIENT_CLINIC_OR_DEPARTMENT_OTHER): Payer: Self-pay | Admitting: Geriatric Medicine

## 2012-09-22 VITALS — BP 120/80 | HR 66 | Temp 98.6°F | Wt 243.0 lb

## 2012-09-22 DIAGNOSIS — R079 Chest pain, unspecified: Secondary | ICD-10-CM

## 2012-09-22 DIAGNOSIS — F410 Panic disorder [episodic paroxysmal anxiety] without agoraphobia: Principal | ICD-10-CM

## 2012-09-22 DIAGNOSIS — F438 Other reactions to severe stress: Secondary | ICD-10-CM

## 2012-09-22 DIAGNOSIS — F4389 Other reactions to severe stress: Secondary | ICD-10-CM

## 2012-09-22 MED ORDER — LORAZEPAM 0.5 MG PO TABS
0.50 mg | ORAL_TABLET | Freq: Every day | ORAL | Status: AC | PRN
Start: 2012-09-22 — End: 2012-10-20

## 2012-09-22 MED ORDER — FLUOXETINE HCL 20 MG PO CAPS
20.0000 mg | ORAL_CAPSULE | Freq: Every day | ORAL | Status: AC
Start: 2012-09-22 — End: 2013-09-23

## 2012-09-22 NOTE — Progress Notes (Signed)
P-call chief complaint: "anxiety"    Received call at: 7:53AM  Returned call at: 8:02AM    HPI: Feels like she's having an anxiety attack. Really wants to be seen today.    A/P: Now that our clinic phones are open, she can call to schedule an appointment to be seen today (ideally this morning).  I will forward this to the front desk staff as well.  She says that she is safe to drive.    Lovie Chol MD, 09/22/2012, 8:11 AM

## 2012-09-22 NOTE — Progress Notes (Signed)
SUBJECTIVE:  Erica Arroyo is a 46 year old female who presents for an acute visit    With the following concern(s): anxiety    #) Chest pain:  - started acutely at 3:30 am this morning, woke her up from sleep  - pain was substernal chest pain, no improvement with tylenol x 2. Felt short of breath, nauseous. No radiation or diaphoresis. Went to ED in [place]- got lab work including cardiac enzymes, EKG- all wnl per patient (we need records). Had a really bad experience in the ED, felt mistreated. Pain resolved an hour ago.   - denies chest wall tenderness, cough, fever, reflux, or pain with swallowing.   - obese. Non-smoker. No family history of early cardiac events. A1C 5.6 (2009), LDL 126 (2011).  - under a lot of stress lately. Feels like she's going a mile a minute, doesn't have much time to herself. Is able to fall asleep at night ("I crash") but she wakes up every 2-3 hours because of stress. Hasn't had a good night's sleep in a long time.   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 Only time to herself is in the car commuting between [place] and [place].    - coping: [employer] Gauze Mom support group, would like therapy. Aroma therapy, incense. Tries to do guided imagery and deep breathing but she is often too distracted.     PROBLEM LIST:  Patient Active Problem List:     Anxiety Associated with Depression     Chronic migraine without aura     PPD positive     Obesity     Hypoglycemia     Asthma     Depression     Sleep stage dysfunction     Neck pain     Vertigo      MEDICATIONS:    Current Outpatient Prescriptions:  FLUoxetine (PROZAC) 20 MG capsule Take 1 capsule by mouth daily. Disp: 30 capsule Rfl: 5   [DISCONTINUED] amitriptyline (ELAVIL) 25 MG tablet Take 1 tablet by mouth nightly. Disp: 90 tablet Rfl: 3   ketorolac (TORADOL) 10 MG tablet Take 1 tablet by mouth every 6 (six) hours as  needed for Pain. Disp: 20 tablet Rfl: 0   zolmitriptan (ZOMIG) 5 MG tablet Take 1 tablet by mouth. Take 1 tablet as needed for headaches.  May repeat dose after 2 hours, but no more than 2 doses in 24 hours.  Do not take more than 9 days a month.    Disp: 12 tablet Rfl: 3   albuterol (PROAIR HFA) 108 (90 BASE) MCG/ACT inhaler Inhale 2 puffs into the lungs every 6 (six) hours as needed for Wheezing and Shortness of breath. Disp: 1 Inhaler Rfl: 3   [DISCONTINUED] fluoxetine (PROZAC) 20 MG capsule Take 1 capsule by mouth daily. Disp: 30 capsule Rfl: 5     No current facility-administered medications for this visit.    ALLERGIES:  Review of Patient's Allergies indicates:   Amoxicillin trihydr*    Hives   Hydromorphone hcl       Rash   Meperidine hcl          Rash   Penicillins             Rash   Tetracycline base       Rash   Tuberculin purified*    Hives    Comment:And sloughing of the  skin    ROS: as per HPI.    VITALS:  BP 120/80  Pulse 66  Temp(Src) 98.6 F (37 C) (Temporal)  Wt 243 lb (110.224 kg)  BMI 44.43 kg/m2  SpO2 98%  Most Recent Weight Reading(s)  09/22/12 : 243 lb (110.224 kg)  04/14/12 : 251 lb (113.853 kg)  03/04/12 : 248 lb (112.492 kg)  02/16/11 : 242 lb (109.77 kg)  12/15/10 : 240 lb (108.863 kg)  GEN: obese female in no acute respiratory distress.   Head: NCAT  Eyes: PERRL, EOMI  ENT: MMM, throat clear, poor dentition, most of her incisors are missing.   NECK: full range of motion, no thyromegaly.   CHEST: no TTP over chest wall. Lungs are clear to auscultation bilaterally, no W/R/R/R.  HEART: RRR, no murmurs, rubs or gallops.   NEURO: Grossly intact  EXT: no edema     LABS:      Most Recent BP Reading(s)  09/22/12 : 120/80  04/14/12 : 100/64  03/04/12 : 130/90      A/P:  Erica Arroyo is a 46 year old female who presents with:    (786.50) Chest pain at rest  (300.01) Panic attack  (primary encounter diagnosis)  Comment: s/p ED visit for chest pain-- told she was having a panic attack. This is  her first diagnosed panic attack. Asymptomatic in office today. Low risk for CAD/ACS based on health profile.   Plan:   - F/u med records from [facility] in [place]   - treat anxiety with panic as below    (309.9) Adjustment reaction to chronic stress  Comment: unable to utilize learned coping mechanisms at this time due to overwhelming anxiety.  Plan:   - start FLUoxetine (PROZAC) 20 MG capsule in the morning  - can use LORazepam (ATIVAN) 0.5 MG tablet daily PRN until fluoxetine starts to work: we discussed that this is a one-time prescription. Benzos are not indicated for long-term treatment of panic disorder  - patient to establish care with [facility] for CBT in [place] closer to her home  - f/u in 3-4 weeks for med f/u: may benefit from fluoxetine dose increase vs. Addition of buspar for acute PRN relief of somatic anxiety symptoms at that time. F/u sleep.        Dispostion:  Return in 3 Weeks for anxiety.        *AVS printed  *Discussed with Dr Elonda Husky

## 2012-09-22 NOTE — Progress Notes (Signed)
PRECEPTOR NOTE  On the day of the patient's visit, I discussed this patient's care with the resident, I reviewed findings with the resident.  I confirm the key elements of history and physical exam as described in resident's note.  I agree with the assessment and plan as described below.  Please see resident's note for further details.

## 2012-09-27 ENCOUNTER — Telehealth (HOSPITAL_BASED_OUTPATIENT_CLINIC_OR_DEPARTMENT_OTHER): Payer: Self-pay | Admitting: Family Medicine

## 2012-09-27 NOTE — Progress Notes (Signed)
Erica Arroyo [id number], 46 year old, female,   Telephone Information:   Home Phone [phone]   Work Phone Not on file.   Mobile [phone]       CALL BACK NUMBER: [phone]  Cell phone:   Other phone:    Available times:    Patient's language of care: English    Patient does not need an interpreter.    Patient's Care Team: [facility] RED TEAM    Person calling on behalf of patient: Patient (self)    Calls today with questions and concerns. Pt was seen and given meds. She is taking the meds but she is still having anxiety. Pl's call back.    Patient's Preferred Pharmacy:   [id number] - [place], [place] - [address] AT [facility] & [address]  Phone: [phone] Fax: [phone]

## 2012-09-28 NOTE — Progress Notes (Signed)
Returned pt call re: anxiety. Still having CP and discomfort. Taking 1/2 mg Ativan - not doing much. When takes it things are not as bad but still there. Does feel that pain is definitely related to stress. 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    Plan to take 1 mg no more than twice a day and 1/2 mg once more- but very short term. Does feel situational. To be very careful - not taking before driving.     Was not able to get in for therapy where recommended by MD due to insurance so has called [facility] # for insurance to see where she can be seen.   Will call back in 1-2 days if not improving.

## 2012-11-02 ENCOUNTER — Emergency Department (HOSPITAL_BASED_OUTPATIENT_CLINIC_OR_DEPARTMENT_OTHER)
Admission: RE | Admit: 2012-11-02 | Disposition: A | Discharge status: Home / Self Care | Payer: Self-pay | Source: Emergency Department | Attending: Emergency Medicine | Admitting: Emergency Medicine

## 2012-11-02 ENCOUNTER — Encounter (HOSPITAL_BASED_OUTPATIENT_CLINIC_OR_DEPARTMENT_OTHER): Payer: Self-pay

## 2012-11-02 MED ORDER — PREDNISONE 50 MG PO TABS
50.00 mg | ORAL_TABLET | Freq: Every day | ORAL | Status: AC
Start: 2012-11-02 — End: 2012-11-07

## 2012-11-02 MED ORDER — FAMOTIDINE 10 MG/ML IV SOLN
40.00 mg | Freq: Once | INTRAVENOUS | Status: AC
Start: 2012-11-02 — End: 2012-11-02
  Administered 2012-11-02: 40 mg via INTRAVENOUS
  Filled 2012-11-02: qty 4

## 2012-11-02 MED ORDER — SODIUM CHLORIDE 0.9 % IV BOLUS
1000.00 mL | Freq: Once | INTRAVENOUS | Status: AC
Start: 2012-11-02 — End: 2012-11-02
  Administered 2012-11-02: 1000 mL via INTRAVENOUS

## 2012-11-02 MED ORDER — DIPHENHYDRAMINE HCL 50 MG/ML IJ SOLN
50.00 mg | Freq: Once | INTRAMUSCULAR | Status: AC
Start: 2012-11-02 — End: 2012-11-02
  Administered 2012-11-02: 50 mg via INTRAVENOUS
  Filled 2012-11-02: qty 1

## 2012-11-02 MED ORDER — METHYLPREDNISOLONE SODIUM SUCC 125 MG IJ SOLR
125.00 mg | Freq: Once | INTRAMUSCULAR | Status: AC
Start: 2012-11-02 — End: 2012-11-02
  Administered 2012-11-02: 125 mg via INTRAVENOUS
  Filled 2012-11-02: qty 125

## 2012-11-02 MED ORDER — EPINEPHRINE 0.3 MG/0.3ML IJ DEVI
0.3000 mg | Freq: Once | INTRAMUSCULAR | Status: AC | PRN
Start: 2012-11-02 — End: 2013-11-02

## 2012-11-02 NOTE — ED Notes (Signed)
Report to J. MacLellan, RN

## 2012-11-02 NOTE — ED Notes (Signed)
Assumed care of patient no acute respiratory distress noted, no voiced complaints warm blanket given to patient, continue to monitor.

## 2012-11-02 NOTE — ED Provider Notes (Signed)
This patient was evaluated in the ED for allergic reaction, although initial history and physical exam information was obtained by the PA , who also made a record of this visit. I independently examined and evaluated this patient and made all diagnostic, treatment and disposition decisions. The patient had eaten shrimp and developed a peripheral rash.  Denies any swelling in her mouth or dyspnea.  She took Benadryl 25 mg and came to the Emergency Department.  She states that she has a history of similar reactions with mayonnaise however never shrimp.      Physical examination  Vital signs: Reviewed  Constitutional:  No acute distress, non-toxic appearance   Eyes:  PERRL, conjunctiva normal   HENT:  Atraumatic, external ears normal, nose normal, oropharynx moist,  Respiratory:  No respiratory distress, normal breath sounds, no rales, no wheezing   Cardiovascular:  Normal rate, normal rhythm, no murmurs.  GI:  Soft, nondistended, non-tender  Musculoskeletal:  No edema, no tenderness, no deformities.   Integument:  Well hydrated, no rash   Neurologic:  Alert & oriented x 3  Psychiatric:  Speech and behavior appropriate     Emergency Department course and medical decision-making:  Patient presents with mild allergic reaction.  She is treated with Benadryl, Solu-Medrol, Pepcid.  On reexamination she is feeling much better.  She was given an EpiPen prescription and encouraged to avoid [place] the future.  Return for worsening symptoms.    For other details of my evaluation of this patient, please see the PA's note.    Impression:  Mild allergic reaction    Electronically signed by: Delorise Royals. Jeanella Craze, DO, 11/02/2012 3:48 PM      This Emergency Department patient encounter note was created using voice-recognition software and in real time during the ED visit. Please excuse any typographical errors that have not been edited out.

## 2012-11-02 NOTE — ED Notes (Signed)
Rash has started to resolve, patient reports feeling less itchy, remains speaking in full sentences without difficulty, lung sounds clear, O2 100%, states "my lips feel numb". Respirations are easy even and unlabored.

## 2012-11-02 NOTE — ED Notes (Signed)
Report given to Mary RN.

## 2012-11-02 NOTE — ED Provider Notes (Signed)
eMERGENCY dEPARTMENT Physician Assistant NOTE    The ED nursing record was reviewed.   The prior medical records as available electronically through Epic were reviewed.  The mode of arrival was Self on 11/02/2012  2:06 PM.      This patient was seen with Emergency Department attending physician Dr. Jeanella Craze    CHIEF COMPLAINT    Patient presents with:    Allergic Reaction - ALLERGIC REACTION TO SHRIMP      HPI    Erica Arroyo is a 47 year old female presenting with complaint of generalized itching.  Patient states this started about one hour prior to arrival.  States she's noted a few scattered light red bumps to her upper arms.  States she has noted a very slight tingling to her lips.  Denies any other associated symptoms.  No fainting or near fainting, chest tightness, wheezing, shortness of breath, abdominal pain vomiting or diarrhea.  No prior occurrences. States she has had prior anaphylactic reaction to mayonnaise but it did not present similarly. States she does not carry an epi pen though. States this started about 30 minutes after lunch and she ate shrimp at lunch.  Denies any recent new exposures.  States she took one Benadryl about 30 minutes prior to arrival with no relief.    PAST MEDICAL HISTORY      Past Medical History    Asthma 09/18/2008    Comment: Started on Flovent 12/09, albuterol prn    Hypoglycemia 06/26/2008    Comment: 9/09: s/p ICU stayf or hypoglycemia. Needs insulinoma work-up, screening for use (abuse) of diabetic meds.   There are case studies on Prozac and Omeprazole that can cause hypoglycemia so advised to stop both for now, will follow sugars with glucometer and restart one at a time in a few days if sugars are stable. Will call to be seen this week.     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 Last CXR 3/06 (-) ?allergy to injection caused positive test.    Depression        PROBLEM LIST  Patient Active Problem List:     Anxiety Associated with Depression     Chronic migraine without aura     PPD positive     Obesity     Hypoglycemia     Asthma     Depression     Sleep stage dysfunction     Neck pain     Vertigo     Adjustment reaction to chronic stress      SURGICAL HISTORY        Past Surgical History    ANESTHESIA CESAREAN DELIVERY ONLY      Comment x3    UNLISTED LAPAROSCOPY PROCEDURE APPENDIX      TOTAL ABDOMINAL HYSTERECT W/WO RMVL TUBE OVARY      Comment ?cervix, at age 42 with "hemorrhaging"    APPENDECTOMY      OB ANTEPARTUM CARE CESAREAN DLVR & POSTPARTUM      TONSILLECTOMY ONE-HALF AGE 52/>      TONSILLECTOMY AND ADENOIDECTOMY         CURRENT MEDICATIONS    1. LORazepam (ATIVAN) 0.5 MG tablet  Route: Oral ACZ:YSAY 0.5 mg by mouth every 6 (six) hours  as needed.  Dispense:  Refill:     2. FLUoxetine (PROZAC) 20 MG capsule  Route: Oral ZOX:WRUE 1 capsule by mouth daily.  Dispense: 30 capsule Refill: 5    3. EXPIRED: ketorolac (TORADOL) 10 MG tablet  Route: Oral AVW:UJWJ 1 tablet by mouth every 6 (six) hours as needed for Pain.  Dispense: 20 tablet Refill: 0    4. zolmitriptan (ZOMIG) 5 MG tablet  Route: Oral XBJ:YNWG 1 tablet by mouth. Take 1 tablet as needed for headaches.  May repeat dose after 2 hours, but no more than 2 doses in 24 hours.  Do not take more than 9 days a month.       Dispense: 12 tablet Refill: 3    5. albuterol (PROAIR HFA) 108 (90 BASE) MCG/ACT inhaler  Route: Inhalation NFA:OZHYQM 2 puffs into the lungs every 6 (six) hours as needed for Wheezing and Shortness of breath.  Dispense: 1 Inhaler Refill: 3      ALLERGIES    Review of Patient's Allergies indicates:   Amoxicillin trihydr*    Hives   Eggs or egg-derived*    Itching   Hydromorphone hcl       Rash   Meperidine hcl          Rash   Penicillins             Rash   Shrimp                  Itching    Tetracycline base       Rash   Tuberculin purified*    Hives    Comment:And sloughing of the skin    FAMILY HISTORY      Family History    Heart Father     Comment: multiple MI first in early 32's, has multiple Stents    Renal Father     Comment: on dialysis    Hypertension Father     GI Father     Comment: diverticulitis    Diabetes Father     No Known Family History Mother     Psychiatric Illness Brother     Comment: bipolar, schizophrenic    Neurological Sister     Comment: ? 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  Has a boyfriend, does not live together.  Been together since 11/08.      Middle daughter bipolar, recently ran away and got married and pregnant at age 44.      Other 2 daughters with behavioral issues and not in regular contact.     1/10:      Many stresses now:     Daughter bipolar, pregnant, noncompliant with meds. She and husband live with pt. Now father just passed away.     2 other kids - 20 and 86, younger lives with her.  Doesn't talk to eldest.     Divorced.         2/10: Daughter is 21,  preg, bipolar, schizoaffective - currently is on meds. . She and her husband live with pt, husband very little help, ? Cognitively impaired    Other 2 children are 24 and 23 years old - had to have daughter go live with father and she was too disruptive.    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         01/24/09- tried to reach pt at [facility] nsg- told she no longer  works there.         3/11- money stress at home. Middle daughter off meds, has left with grandson (9 mo). 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         Had a tonsillectomy and adenoidectomy                            REVIEW OF SYSTEMS     The pertinent positives are reviewed in the HPI above. All other systems were reviewed and are negative.    PHYSICAL EXAM      Vital Signs: BP 141/88  Pulse 80  Temp(Src) 98.3 F  Resp 22  Wt 108.863 kg (240 lb)  BMI 43.89 kg/m2  SpO2 100%     Constitutional: Well-developed, Well-nourished, Non-toxic appearance. Speaking full sentences.  HEAD: Without signs of trauma. No soft tissue swelling or tenderness.   NECK: No C-spine tenderness; No tenderness, swelling, or step-off. Full range of motion without discomfort. Supple with no meningismus.   EYES: Pupils are equal and reactive. Extraocular movements are intact. No scleral icterus.   ENT:  Oropharynx clear, airway patent. Mucus membranes are moist.   LYMPHATICS: No palpable cervical lymphadenopathy.   CV: RRR, No MRG, radial pulses 2+ B/L  PULMONARY:  CTAB, No WRR, No stridor, accessory muscle use or tripoding  ABDOMINAL: Soft, NTND, No palpable organomegaly or mass. No CVA tenderness.    MUSCULOSKELETAL : Moving all 4 extremities. Ambulatory w/ a steady gait. The spine straight and nontender.   SKIN: Warm and dry .  Several scattered faintly erythematous macules and papules over her bilateral upper extremities.  No other lesions noted.  No mucocutaneous lesions or skin sloughing.  NEUROLOGIC: Normal mental status. Cranial nerves, motor, sensor, and cerebellar function are grossly intact.   PSYCHIATRIC: Normal affect      RESULTS  No results found for this visit on 11/02/12 (from the past 24 hour(s)).       MEDICATIONS ADMINISTERED ON THIS VISIT    Medication Orders Placed This Encounter  LORazepam (ATIVAN) 0.5 MG tablet   Sig: Take 0.5 mg by mouth every 6 (six) hours as needed.  diphenhydrAMINE (BENADRYL) injection 50 mg   Sig:    famotidine (PEPCID) injection 40 mg   Sig:  methylPREDNISolone sodium succinate (Solu-MEDROL) injection 125 mg   Sig:   sodium chloride 0.9 % IV bolus 1,000 mL   Sig:     ED COURSE & MEDICAL DECISION MAKING      I reviewed the patient's past medical history/problem list, past surgical history, medication list, social history and allergies.    Arrival: Pt arrived in stable condition and required no immediate interventions.    ED Decision Making & Course: Pt is a 47 year old female with above exam and history of present illness.  Suspect allergic etiology.  IV Benadryl, Pepcid and Solu-Medrol ordered.  Patient observed here and her rash and itching has steadily reduced.  She reports no additional symptoms.  She is in stable condition for discharge to home.  Discharged with short course of oral steroids, advised Benadryl when necessary.  Given prescription for EpiPen.  To follow up primary care provider and return as needed.  Pt remained hemodynamically stable during their stay in the emergency department.  Patient given discharge instructions including return precautions. They express understanding and agreement with the plan of care all questions answered here.     Diagnosis: Allergic reaction    Disposition: Discharged home in stable and improved condition.    Billee Cashing, PA-C  [facility]  Department of Emergency Medicine      This record was generated in real time using voice recognition software. I proof-read and corrected any voice recognitions errors found. Please excuse any remaining voice recognition errors which were not detected.

## 2012-11-02 NOTE — ED Notes (Signed)
Patient Disposition    Patient education for diagnosis, medications, activity, diet and follow-up.  Patient left ED 5:37 PM.  Patient rep received written instructions.  Interpreter to provide instructions: No    Discharged to: Discharged to home

## 2012-11-02 NOTE — Discharge Instructions (Signed)
What we did in the Emergency Department (ED):  -Exam and history   -- Suggested rash likely due to allergic reaction.   -- Medicated with IV fluid, Solu-Medrol, Pepcid and Benadryl.    Next steps:  - Follow up with your regular doctor this week to discuss this emergency department visit.  -Take prednisone course as directed until finished  -Take Benadryl every 4-6 hours 1-2 tablets as needed for itching/rash.  -See EpiPen instructions and keep one with you at all times.  Use as directed as needed.  See attached paperwork.  -     Come back to the Emergency Department (ED) for:  Return to the emergency department with any worsening, any concerning symptoms or if unable to obtain follow-up. Worsening signs and symptoms include but are not limited to worsening rash, mouth or throat swelling or tightness, chest pain or shortness of breath, wheezing, abdominal pain vomiting or diarrhea, fainting or near fainting, numbness, weakness, or any other new or concerning symptoms    Thank you for your patience.

## 2012-11-02 NOTE — ED Triage Note (Signed)
Self presents to ED c/o allergic reaction to shrimp. Had shrimp for lunch, has eaten it before, took benadryl at 1300, reports feeling "itchy all over".  Denies shortness of breath, lung sounds clear,

## 2012-11-03 ENCOUNTER — Ambulatory Visit (HOSPITAL_BASED_OUTPATIENT_CLINIC_OR_DEPARTMENT_OTHER): Payer: PRIVATE HEALTH INSURANCE | Admitting: Family Medicine

## 2013-07-11 ENCOUNTER — Telehealth (HOSPITAL_BASED_OUTPATIENT_CLINIC_OR_DEPARTMENT_OTHER): Payer: Self-pay | Admitting: Family Medicine

## 2013-07-20 ENCOUNTER — Telehealth (HOSPITAL_BASED_OUTPATIENT_CLINIC_OR_DEPARTMENT_OTHER): Payer: Self-pay | Admitting: Family Medicine

## 2013-07-20 NOTE — Progress Notes (Signed)
Pt lives in [place]. Having a lot of anxiety, working on adopting 2 kids who have been with her for 3 y. Looking for housing for them. Her hx of depr and anxiety will qualify her for a program that will help with rent.     Discussed with pt and letter written and will be faxed.   She just got insurance there so will be transferring care.   AG

## 2014-03-06 ENCOUNTER — Encounter (HOSPITAL_BASED_OUTPATIENT_CLINIC_OR_DEPARTMENT_OTHER): Payer: Self-pay

## 2014-10-23 ENCOUNTER — Telehealth (HOSPITAL_BASED_OUTPATIENT_CLINIC_OR_DEPARTMENT_OTHER): Payer: Self-pay

## 2014-10-23 NOTE — Progress Notes (Signed)
Pt believes her Dr office faxed over release.  I will continue to look for it.

## 2014-10-23 NOTE — Progress Notes (Signed)
I faxed over pt vital sign records from 11/02/12- 01/04/08 to Dr Kirtland BouchardK. Shea [facility]y [facility]  Fax # [phone]

## 2014-10-23 NOTE — Progress Notes (Signed)
Pt will have MD fax to [phone] pt release of information form & I will fax back vitals.

## 2014-10-23 NOTE — Telephone Encounter (Signed)
-----   Message from Christena Flakeobin Huckleberry sent at 10/23/2014  8:05 AM EST -----  Regarding: Weight loss history  Contact: [phone]  Erica Arroyo [phone], 49 year old, female    Calls today: Pt is having bariatric surgery and is living in [place] now.  She needs the last 3 or 4 years of her weight history in order to have this surgery.  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  MR doesn't know what she needs to have this surgery so she only wants to speak to a nurse.     Person calling on behalf of patient: Patient (self)    CALL BACK NUMBER: [phone]      Patient's language of care: English    Patient does not need an interpreter.    Patient's PCP: NON STAFF PROVIDER

## 2020-12-26 ENCOUNTER — Encounter (HOSPITAL_BASED_OUTPATIENT_CLINIC_OR_DEPARTMENT_OTHER): Payer: Self-pay
